# Patient Record
Sex: Male | Born: 1965 | Race: White | Hispanic: No | Smoking: Former smoker
Health system: Southern US, Community
[De-identification: ages and names within clinical notes are randomized; demographics above are authoritative.]

## PROBLEM LIST (undated history)

## (undated) DIAGNOSIS — R011 Cardiac murmur, unspecified: Secondary | ICD-10-CM

## (undated) DIAGNOSIS — N529 Male erectile dysfunction, unspecified: Secondary | ICD-10-CM

## (undated) HISTORY — DX: Male erectile dysfunction, unspecified: N52.9

## (undated) HISTORY — PX: NO PAST SURGERIES: SHX2092

## (undated) HISTORY — DX: Cardiac murmur, unspecified: R01.1

---
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
## 2006-07-13 ENCOUNTER — Ambulatory Visit: Payer: Self-pay | Admitting: Family Medicine

## 2006-12-20 ENCOUNTER — Ambulatory Visit: Payer: Self-pay | Admitting: Family Medicine

## 2006-12-30 ENCOUNTER — Ambulatory Visit: Payer: Self-pay | Admitting: Family Medicine

## 2009-06-14 ENCOUNTER — Ambulatory Visit: Payer: Self-pay | Admitting: Internal Medicine

## 2015-08-07 ENCOUNTER — Ambulatory Visit (INDEPENDENT_AMBULATORY_CARE_PROVIDER_SITE_OTHER): Payer: BLUE CROSS/BLUE SHIELD | Admitting: Family Medicine

## 2015-08-07 ENCOUNTER — Ambulatory Visit: Payer: Self-pay | Admitting: Family Medicine

## 2015-08-07 ENCOUNTER — Encounter: Payer: Self-pay | Admitting: Family Medicine

## 2015-08-07 VITALS — BP 120/76 | HR 83 | Temp 98.7°F | Ht 70.0 in | Wt 149.8 lb

## 2015-08-07 DIAGNOSIS — J40 Bronchitis, not specified as acute or chronic: Secondary | ICD-10-CM

## 2015-08-07 DIAGNOSIS — J0100 Acute maxillary sinusitis, unspecified: Secondary | ICD-10-CM | POA: Diagnosis not present

## 2015-08-07 MED ORDER — DOXYCYCLINE HYCLATE 100 MG PO TABS: 100.0000 mg | ORAL_TABLET | Freq: Two times a day (BID) | ORAL | Status: DC

## 2015-08-07 NOTE — Progress Notes (Addendum)
Name: Patrick Fuller   MRN: [id number]    DOB: [birth year]   Date:08/07/2015       Progress Note  Subjective  Chief Complaint  Chief Complaint  Patient presents with  . Cough    Sore throat, headache, fatigue. Patient that he has been sick times one week  . Pruritis    Patient states that this is located on the tip of his penis    Cough This is a new problem. The current episode started in the past 7 days. The problem has been gradually worsening. The problem occurs every few minutes. The cough is productive of purulent sputum (yellow). Associated symptoms include headaches, hemoptysis, myalgias, nasal congestion, postnasal drip, rhinorrhea and a sore throat. Pertinent negatives include no chest pain, chills, ear congestion, ear pain, fever, heartburn, rash, shortness of breath, weight loss or wheezing. The symptoms are aggravated by cold air and lying down. There is no history of asthma, bronchitis, environmental allergies or pneumonia.  Sinusitis This is a new problem. The current episode started in the past 7 days. The problem has been waxing and waning since onset. Associated symptoms include coughing, headaches, sinus pressure and a sore throat. Pertinent negatives include no chills, ear pain, neck pain or shortness of breath.    No problem-specific assessment & plan notes found for this encounter.   History reviewed. No pertinent past medical history.  Past Surgical History  Procedure Laterality Date  . No past surgeries      Family History  Problem Relation Age of Onset  . Prostate cancer Father   . Multiple sclerosis Mother     Social History   Social History  . Marital Status: Married    Spouse Name: N/A  . Number of Children: N/A  . Years of Education: N/A   Occupational History  . Not on file.   Social History Main Topics  . Smoking status: Never Smoker   . Smokeless tobacco: Not on file  . Alcohol Use: 0.0 oz/week    0 Standard drinks or equivalent  per week     Comment: occasionally  . Drug Use: No  . Sexual Activity: Not on file   Other Topics Concern  . Not on file   Social History Narrative  . No narrative on file    Allergies  Allergen Reactions  . Amoxicillin Hives  . Erythromycin Nausea And Vomiting     Review of Systems  Constitutional: Negative for fever, chills, weight loss and malaise/fatigue.  HENT: Positive for postnasal drip, rhinorrhea, sinus pressure and sore throat. Negative for ear discharge and ear pain.   Eyes: Negative for blurred vision.  Respiratory: Positive for cough and hemoptysis. Negative for sputum production, shortness of breath and wheezing.   Cardiovascular: Negative for chest pain, palpitations and leg swelling.  Gastrointestinal: Negative for heartburn, nausea, abdominal pain, diarrhea, constipation, blood in stool and melena.  Genitourinary: Negative for dysuria, urgency, frequency and hematuria.       Urethral irratation  Musculoskeletal: Positive for myalgias. Negative for back pain, joint pain and neck pain.  Skin: Negative for rash.  Neurological: Positive for headaches. Negative for dizziness, tingling, sensory change and focal weakness.  Endo/Heme/Allergies: Negative for environmental allergies and polydipsia. Does not bruise/bleed easily.  Psychiatric/Behavioral: Negative for depression and suicidal ideas. The patient is not nervous/anxious and does not have insomnia.      Objective  Filed Vitals:   08/07/15 1106  BP: 120/76  Pulse: 83  Temp: 98.7 F (37.1  C)  TempSrc: Oral  Height: 5\' 10"  (1.778 m)  Weight: 149 lb 12.8 oz (67.949 kg)  SpO2: 98%    Physical Exam  Constitutional: He is oriented to person, place, and time and well-developed, well-nourished, and in no distress.  HENT:  Head: Normocephalic.  Right Ear: External ear normal.  Left Ear: External ear normal.  Nose: Right sinus exhibits maxillary sinus tenderness. Left sinus exhibits maxillary sinus  tenderness.  Mouth/Throat: Posterior oropharyngeal erythema present.  Eyes: Conjunctivae and EOM are normal. Pupils are equal, round, and reactive to light. Right eye exhibits no discharge. Left eye exhibits no discharge. No scleral icterus.  Neck: Normal range of motion. Neck supple. No JVD present. No tracheal deviation present. No thyromegaly present.  Cardiovascular: Normal rate, regular rhythm, normal heart sounds and intact distal pulses.  Exam reveals no gallop and no friction rub.   No murmur heard. Pulmonary/Chest: Breath sounds normal. No respiratory distress. He has no wheezes. He has no rales.  Abdominal: Soft. Bowel sounds are normal. He exhibits no mass. There is no hepatosplenomegaly. There is no tenderness. There is no rebound, no guarding and no CVA tenderness.  Musculoskeletal: Normal range of motion. He exhibits no edema or tenderness.  Lymphadenopathy:    He has no cervical adenopathy.  Neurological: He is alert and oriented to person, place, and time. He has normal sensation, normal strength, normal reflexes and intact cranial nerves. No cranial nerve deficit.  Skin: Skin is warm. No rash noted.  Psychiatric: Mood and affect normal.  Nursing note and vitals reviewed.     Assessment & Plan  Problem List Items Addressed This Visit    None    Visit Diagnoses    Acute maxillary sinusitis, recurrence not specified    -  Primary    Relevant Medications    doxycycline (VIBRA-TABS) 100 MG tablet    Bronchitis        Relevant Medications    doxycycline (VIBRA-TABS) 100 MG tablet         Dr. Hayden Rasmusseneanna Jones [facility]  08/07/2015

## 2016-06-29 ENCOUNTER — Ambulatory Visit (INDEPENDENT_AMBULATORY_CARE_PROVIDER_SITE_OTHER): Payer: BLUE CROSS/BLUE SHIELD | Admitting: Family Medicine

## 2016-06-29 VITALS — BP 130/60 | HR 80 | Ht 70.0 in | Wt 156.0 lb

## 2016-06-29 DIAGNOSIS — R351 Nocturia: Secondary | ICD-10-CM

## 2016-06-29 DIAGNOSIS — N529 Male erectile dysfunction, unspecified: Secondary | ICD-10-CM | POA: Diagnosis not present

## 2016-06-29 DIAGNOSIS — Z1211 Encounter for screening for malignant neoplasm of colon: Secondary | ICD-10-CM | POA: Diagnosis not present

## 2016-06-29 DIAGNOSIS — E785 Hyperlipidemia, unspecified: Secondary | ICD-10-CM

## 2016-06-29 DIAGNOSIS — N401 Enlarged prostate with lower urinary tract symptoms: Secondary | ICD-10-CM

## 2016-06-29 DIAGNOSIS — Z0000 Encounter for general adult medical examination without abnormal findings: Secondary | ICD-10-CM

## 2016-06-29 LAB — HEMOCCULT GUIAC POC 1CARD (OFFICE): Fecal Occult Blood, POC: NEGATIVE

## 2016-06-29 MED ORDER — SILDENAFIL CITRATE 100 MG PO TABS: 100.0000 mg | ORAL_TABLET | Freq: Every day | ORAL | 11 refills | Status: DC | PRN

## 2016-06-29 NOTE — Progress Notes (Signed)
Name: Patrick Fuller   MRN: [id number]    DOB: May 04, 1966   Date:06/29/2016       Progress Note  Subjective  Chief Complaint  Chief Complaint  Patient presents with  . Erectile Dysfunction    needs refill on viagra    Patient presents for annual physical exam.   Erectile Dysfunction  This is a chronic problem. The current episode started more than 1 year ago. The problem has been waxing and waning since onset. The nature of his difficulty is maintaining erection. He reports no anxiety. Irritative symptoms include nocturia. Irritative symptoms do not include frequency or urgency. Obstructive symptoms do not include dribbling, incomplete emptying, an intermittent stream, a slower stream, straining or a weak stream. Pertinent negatives include no chills, dysuria, genital pain, hematuria, hesitancy or inability to urinate. Nothing aggravates the symptoms. Past treatments include nothing. The treatment provided mild relief. He has had no adverse reactions caused by medications. There are no known risk factors.  Benign Prostatic Hypertrophy  This is a chronic problem. The problem has been waxing and waning since onset. Irritative symptoms include nocturia. Irritative symptoms do not include frequency or urgency. Obstructive symptoms do not include dribbling, incomplete emptying, an intermittent stream, a slower stream, straining or a weak stream. Pertinent negatives include no chills, dysuria, genital pain, hematuria, hesitancy, nausea or vomiting. Nothing aggravates the symptoms. Past treatments include nothing.    No problem-specific Assessment & Plan notes found for this encounter.   No past medical history on file.  Past Surgical History:  Procedure Laterality Date  . NO PAST SURGERIES      Family History  Problem Relation Age of Onset  . Prostate cancer Father   . Multiple sclerosis Mother     Social History   Social History  . Marital status: Married    Spouse name: N/A   . Number of children: N/A  . Years of education: N/A   Occupational History  . Not on file.   Social History Main Topics  . Smoking status: Never Smoker  . Smokeless tobacco: Not on file  . Alcohol use 0.0 oz/week     Comment: occasionally  . Drug use: No  . Sexual activity: Not on file   Other Topics Concern  . Not on file   Social History Narrative  . No narrative on file    Allergies  Allergen Reactions  . Amoxicillin Hives  . Erythromycin Nausea And Vomiting     Review of Systems  Constitutional: Negative for chills, fever, malaise/fatigue and weight loss.  HENT: Negative for ear discharge, ear pain and sore throat.   Eyes: Negative for blurred vision.  Respiratory: Negative for cough, sputum production, shortness of breath and wheezing.   Cardiovascular: Negative for chest pain, palpitations and leg swelling.  Gastrointestinal: Negative for abdominal pain, blood in stool, constipation, diarrhea, heartburn, melena, nausea and vomiting.  Genitourinary: Positive for nocturia. Negative for dysuria, frequency, hematuria, hesitancy, incomplete emptying and urgency.  Musculoskeletal: Negative for back pain, joint pain, myalgias and neck pain.  Skin: Negative for rash.  Neurological: Negative for dizziness, tingling, sensory change, focal weakness and headaches.  Endo/Heme/Allergies: Negative for environmental allergies and polydipsia. Does not bruise/bleed easily.  Psychiatric/Behavioral: Negative for depression and suicidal ideas. The patient is not nervous/anxious and does not have insomnia.      Objective  Vitals:   06/29/16 0821  BP: 130/60  Pulse: 80  Weight: 156 lb (70.8 kg)  Height: 5\' 10"  (1.778 m)  Physical Exam  Constitutional: He is oriented to person, place, and time and well-developed, well-nourished, and in no distress.  HENT:  Head: Normocephalic.  Right Ear: Tympanic membrane, external ear and ear canal normal.  Left Ear: Tympanic membrane,  external ear and ear canal normal.  Nose: Nose normal.  Mouth/Throat: Oropharynx is clear and moist. No oropharyngeal exudate, posterior oropharyngeal edema or posterior oropharyngeal erythema.  Eyes: Conjunctivae and EOM are normal. Pupils are equal, round, and reactive to light. Right eye exhibits no discharge. Left eye exhibits no discharge. No scleral icterus.  Fundoscopic exam:      The right eye shows no arteriolar narrowing, no AV nicking and no papilledema.       The left eye shows no arteriolar narrowing, no AV nicking and no papilledema.  Neck: Trachea normal and normal range of motion. Neck supple. No JVD present. No spinous process tenderness and no muscular tenderness present. No tracheal deviation present. No thyromegaly present.  Cardiovascular: Normal rate, regular rhythm, S1 normal, S2 normal, normal heart sounds, intact distal pulses and normal pulses.  Exam reveals no gallop, no S3, no S4 and no friction rub.   No murmur heard. Pulmonary/Chest: Breath sounds normal. No respiratory distress. He has no wheezes. He has no rales. Right breast exhibits no inverted nipple, no mass, no nipple discharge, no skin change and no tenderness. Left breast exhibits no inverted nipple, no mass, no nipple discharge, no skin change and no tenderness. Breasts are symmetrical.  Abdominal: Soft. Bowel sounds are normal. He exhibits no mass. There is no hepatosplenomegaly. There is no tenderness. There is no rebound, no guarding and no CVA tenderness.  Genitourinary: Rectum normal, prostate normal, testes/scrotum normal and penis normal.  Musculoskeletal: Normal range of motion. He exhibits no edema or tenderness.  Lymphadenopathy:       Head (right side): No submental adenopathy present.       Head (left side): No submental and no submandibular adenopathy present.    He has no cervical adenopathy.    He has no axillary adenopathy.  Neurological: He is alert and oriented to person, place, and time.  He has normal sensation, normal strength, normal reflexes and intact cranial nerves. No cranial nerve deficit.  Skin: Skin is warm. No rash noted.  Psychiatric: Mood, affect and judgment normal.  Nursing note and vitals reviewed.     Assessment & Plan  Problem List Items Addressed This Visit    None    Visit Diagnoses    Annual physical exam    -  Primary   Relevant Orders   Lipid Profile   BPH associated with nocturia       Relevant Orders   Renal function panel   PSA   Erectile dysfunction, unspecified erectile dysfunction type       Relevant Medications   sildenafil (VIAGRA) 100 MG tablet   Colon cancer screening       Relevant Orders   POCT occult blood stool   Hyperlipidemia, unspecified hyperlipidemia type       Relevant Medications   sildenafil (VIAGRA) 100 MG tablet   Other Relevant Orders   Lipid Profile        Dr. Elizabeth Sauer [facility]  06/29/16

## 2016-06-30 LAB — RENAL FUNCTION PANEL
ALBUMIN: 4.6 g/dL (ref 3.5–5.5)
BUN/Creatinine Ratio: 20 (ref 9–20)
BUN: 13 mg/dL (ref 6–24)
CALCIUM: 9.1 mg/dL (ref 8.7–10.2)
CHLORIDE: 103 mmol/L (ref 96–106)
CO2: 24 mmol/L (ref 18–29)
CREATININE: 0.66 mg/dL — AB (ref 0.76–1.27)
GFR calc Af Amer: 132 mL/min/{1.73_m2} (ref >59)
GFR calc non Af Amer: 114 mL/min/{1.73_m2} (ref >59)
Glucose: 89 mg/dL (ref 65–99)
PHOSPHORUS: 3.1 mg/dL (ref 2.5–4.5)
Potassium: 4.6 mmol/L (ref 3.5–5.2)
Sodium: 143 mmol/L (ref 134–144)

## 2016-06-30 LAB — LIPID PANEL
CHOLESTEROL TOTAL: 185 mg/dL (ref 100–199)
Chol/HDL Ratio: 3.8 ratio units (ref 0.0–5.0)
HDL: 49 mg/dL (ref >39)
LDL CALC: 123 mg/dL — AB (ref 0–99)
TRIGLYCERIDES: 65 mg/dL (ref 0–149)
VLDL Cholesterol Cal: 13 mg/dL (ref 5–40)

## 2016-06-30 LAB — PSA: PROSTATE SPECIFIC AG, SERUM: 0.8 ng/mL (ref 0.0–4.0)

## 2016-07-02 ENCOUNTER — Other Ambulatory Visit: Payer: Self-pay

## 2016-09-21 ENCOUNTER — Other Ambulatory Visit: Payer: Self-pay

## 2016-09-21 DIAGNOSIS — N529 Male erectile dysfunction, unspecified: Secondary | ICD-10-CM

## 2016-09-21 DIAGNOSIS — R351 Nocturia: Secondary | ICD-10-CM

## 2016-10-21 NOTE — Progress Notes (Signed)
10/23/2016 9:06 AM   Patrick Fuller March 20, 1966 [id number]  Referring provider: No referring provider defined for this encounter.  Chief Complaint  Patient presents with  . New Patient (Initial Visit)    ED  /   Nocturia  referred by Dr. Dub Amis    HPI: Patient is a 51 year old Caucasian male with ED and BPH with L UTS associated with nocturia who presents as a referral from Dr. Yetta Barre for further evaluation and management of his ED.  Erectile dysfunction His SHIM score is 10, which is moderate ED.   He has been having difficulty with erections for 3 to 4 years.   His major complaint is maintaining an erections and a lack of firmness.  His libido is preserved.   His risk factors for ED are age, BPH, HLD and stress, He denies any painful erections or curvatures with his erections.   He is still having/no longer having spontaneous erections.  He has tried PDE5-inhibitors in the past with success.       SHIM    Row Name 10/23/16 0845         SHIM: Over the last 6 months:   How do you rate your confidence that you could get and keep an erection? Very Low     When you had erections with sexual stimulation, how often were your erections hard enough for penetration (entering your partner)? A Few Times (much less than half the time)     During sexual intercourse, how often were you able to maintain your erection after you had penetrated (entered) your partner? A Few Times (much less than half the time)     During sexual intercourse, how difficult was it to maintain your erection to completion of intercourse? Very Difficult     When you attempted sexual intercourse, how often was it satisfactory for you? Sometimes (about half the time)       SHIM Total Score   SHIM 10        Score: 1-7 Severe ED 8-11 Moderate ED 12-16 Mild-Moderate ED 17-21 Mild ED 22-25 No ED    BPH WITH LUTS His IPSS score today is 13, which is moderate lower urinary tract symptomatology. He is  mixed with his quality life due to his urinary symptoms.  His PVR is 0 mL.  His major complaints today are frequency, urgency, nocturia x 1-5, intermittency and a weak urinary stream.  He has had these symptoms for 2 to 3 months.  He denies any dysuria, hematuria or suprapubic pain.      He also denies any recent fevers, chills, nausea or vomiting.  His father has been diagnosed with prostate cancer ten years ago and was treated with radiation.  No sign of reoccurrence at this time.      IPSS    Row Name 10/23/16 0800         International Prostate Symptom Score   How often have you had the sensation of not emptying your bladder? Less than half the time     How often have you had to urinate less than every two hours? Less than half the time     How often have you found you stopped and started again several times when you urinated? Less than 1 in 5 times     How often have you found it difficult to postpone urination? Less than 1 in 5 times     How often have you had a weak  urinary stream? Less than half the time     How often have you had to strain to start urination? Less than half the time     How many times did you typically get up at night to urinate? 3 Times     Total IPSS Score 13       Quality of Life due to urinary symptoms   If you were to spend the rest of your life with your urinary condition just the way it is now how would you feel about that? Mixed        Score:  1-7 Mild 8-19 Moderate 20-35 Severe    PMH: Past Medical History:  Diagnosis Date  . ED (erectile dysfunction)   . Heart murmur     Surgical History: Past Surgical History:  Procedure Laterality Date  . NO PAST SURGERIES      Home Medications:  Allergies as of 10/23/2016      Reactions   Amoxicillin Hives   Erythromycin Nausea And Vomiting      Medication List       Accurate as of 10/23/16  9:06 AM. Always use your most recent med list.          sildenafil 100 MG tablet Commonly known as:   VIAGRA Take 1 tablet (100 mg total) by mouth daily as needed for erectile dysfunction.   thiamine 100 MG tablet Commonly known as:  VITAMIN B-1 Take 100 mg by mouth daily.   VITAMIN B-2 PO Take 1 tablet by mouth daily.       Allergies:  Allergies  Allergen Reactions  . Amoxicillin Hives  . Erythromycin Nausea And Vomiting    Family History: Family History  Problem Relation Age of Onset  . Multiple sclerosis Mother   . Prostate cancer Father   . Kidney cancer Neg Hx   . Bladder Cancer Neg Hx     Social History:  reports that he has quit smoking. He has never used smokeless tobacco. He reports that he drinks alcohol. He reports that he does not use drugs.  ROS: UROLOGY Frequent Urination?: Yes Hard to postpone urination?: Yes Burning/pain with urination?: No Get up at night to urinate?: Yes Leakage of urine?: No Urine stream starts and stops?: No Trouble starting stream?: Yes Do you have to strain to urinate?: No Blood in urine?: No Urinary tract infection?: No Sexually transmitted disease?: No Injury to kidneys or bladder?: No Painful intercourse?: No Weak stream?: Yes Erection problems?: Yes Penile pain?: No  Gastrointestinal Nausea?: No Vomiting?: No Indigestion/heartburn?: No Diarrhea?: No Constipation?: No  Constitutional Fever: No Night sweats?: No Weight loss?: No Fatigue?: No  Skin Skin rash/lesions?: No Itching?: No  Eyes Blurred vision?: No Double vision?: No  Ears/Nose/Throat Sore throat?: No Sinus problems?: No  Hematologic/Lymphatic Swollen glands?: No Easy bruising?: No  Cardiovascular Leg swelling?: No Chest pain?: No  Respiratory Cough?: No Shortness of breath?: No  Endocrine Excessive thirst?: No  Musculoskeletal Back pain?: Yes Joint pain?: No  Neurological Headaches?: Yes Dizziness?: Yes  Psychologic Depression?: No Anxiety?: No  Physical Exam: BP 119/73   Pulse 79   Ht 5\' 10"  (1.778 m)   Wt  155 lb (70.3 kg)   BMI 22.24 kg/m   Constitutional: Well nourished. Alert and oriented, No acute distress. HEENT: [place] AT, moist mucus membranes. Trachea midline, no masses. Cardiovascular: No clubbing, cyanosis, or edema. Respiratory: Normal respiratory effort, no increased work of breathing. GI: Abdomen is soft, non tender, non distended,  no abdominal masses. Liver and spleen not palpable.  No hernias appreciated.  Stool sample for occult testing is not indicated.   GU: No CVA tenderness.  No bladder fullness or masses.  Patient with circumcised phallus. Urethral meatus is patent.  No penile discharge. No penile lesions or rashes. Scrotum without lesions, cysts, rashes and/or edema.  Testicles are located scrotally bilaterally. No masses are appreciated in the testicles. Left and right epididymis are normal. Rectal: Patient with  normal sphincter tone. Anus and perineum without scarring or rashes. No rectal masses are appreciated. Prostate is approximately 45 grams, no nodules are appreciated. Seminal vesicles are normal. Skin: No rashes, bruises or suspicious lesions. Lymph: No cervical or inguinal adenopathy. Neurologic: Grossly intact, no focal deficits, moving all 4 extremities. Psychiatric: Normal mood and affect.  Laboratory Data: PSA History  0.8 ng/mL in 06/2016 Lab Results  Component Value Date   CREATININE 0.66 (L) 06/29/2016       Component Value Date/Time   CHOL 185 06/29/2016 0915   HDL 49 06/29/2016 0915   CHOLHDL 3.8 06/29/2016 0915   LDLCALC 123 (H) 06/29/2016 0915    Pertinent Imaging: Results for Patrick Fuller, Patrick Fuller (MRN [id number]) as of 10/23/2016 08:59  Ref. Range 10/23/2016 08:50  Scan Result Unknown 0    Assessment & Plan:    1. Erectile dysfunction  - SHIM score is 10  - I explained to the patient that in order to achieve an erection it takes good functioning of the nervous system (parasympathetic, sympathetic, sensory and motor), good blood flow into  the erectile tissue of the penis and a desire to have sex  - I explained that conditions like diabetes, hypertension, coronary artery disease, peripheral vascular disease, smoking, alcohol consumption, age, sleep apnea and BPH can diminish the ability to have an erection  - A recent study published in Sex Med 2018 Apr 13 revealed moderate to vigorous aerobic exercise for 40 minutes 4 times per week can decrease erectile problems caused by physical inactivity, obesity, hypertension, metabolic syndrome and/or cardiovascular diseases  - Continue Viagra   - RTC pending labs  2. BPH with LUTS  - IPSS score is 13/3  - Continue conservative management, avoiding bladder irritants and timed voiding's  - most bothersome symptoms is/are nocturia; PVR is 0 mL  - RTC pending sleep apnea evaluation  3. Nocturia  - I explained to the patient that nocturia is often multi-factorial and difficult to treat.  Sleeping disorders, heart conditions, peripheral vascular disease, diabetes, an enlarged prostate for men, an urethral stricture causing bladder outlet obstruction and/or certain medications can contribute to nocturia.  - I have suggested that the patient avoid caffeine after noon and alcohol in the evening.  He or she may also benefit from fluid restrictions after 6:00 in the evening and voiding just prior to bedtime.  - I have explained that research studies have showed that over 84% of patients with sleep apnea reported frequent nighttime urination.   With sleep apnea, oxygen decreases, carbon dioxide increases, the blood become more acidic, the heart rate drops and blood vessels in the lung constrict.  The body is then alerted that something is very wrong. The sleeper must wake enough to reopen the airway. By this time, the heart is racing and experiences a false signal of fluid overload. The heart excretes a hormone-like protein that tells the body to get rid of sodium and water, resulting in nocturia.  -  I  also informed the patient that  a recent study noted that decreasing sodium intake to 2.3 grams daily, if they don't have issues with hyponatremia, can also reduce the number of nightly voids  - The patient may benefit from a discussion with his or her primary care physician to see if he or she has risk factors for sleep apnea or other sleep disturbances and obtaining a sleep study.  - explained there is a new medication for nocturia, but he needs to be evaluated for sleep apnea prior to prescribing the medication  Return for pending labs.  These notes generated with voice recognition software. I apologize for typographical errors.  Michiel Cowboy, PA-C  [facility] [address], [address] [place], [place] [address] [phone]

## 2016-10-22 ENCOUNTER — Other Ambulatory Visit: Payer: Self-pay | Admitting: *Deleted

## 2016-10-22 DIAGNOSIS — N529 Male erectile dysfunction, unspecified: Secondary | ICD-10-CM

## 2016-10-23 ENCOUNTER — Other Ambulatory Visit
Admission: RE | Admit: 2016-10-23 | Discharge: 2016-10-23 | Disposition: A | Discharge status: Home / Self Care | Payer: BLUE CROSS/BLUE SHIELD | Source: Ambulatory Visit | Attending: Urology | Admitting: Urology

## 2016-10-23 ENCOUNTER — Encounter: Payer: Self-pay | Admitting: Urology

## 2016-10-23 ENCOUNTER — Ambulatory Visit (INDEPENDENT_AMBULATORY_CARE_PROVIDER_SITE_OTHER): Payer: BLUE CROSS/BLUE SHIELD | Admitting: Urology

## 2016-10-23 VITALS — BP 119/73 | HR 79 | Ht 70.0 in | Wt 155.0 lb

## 2016-10-23 DIAGNOSIS — N401 Enlarged prostate with lower urinary tract symptoms: Secondary | ICD-10-CM | POA: Diagnosis not present

## 2016-10-23 DIAGNOSIS — N529 Male erectile dysfunction, unspecified: Secondary | ICD-10-CM | POA: Diagnosis not present

## 2016-10-23 DIAGNOSIS — N138 Other obstructive and reflux uropathy: Secondary | ICD-10-CM | POA: Diagnosis not present

## 2016-10-23 DIAGNOSIS — R351 Nocturia: Secondary | ICD-10-CM

## 2016-10-23 LAB — TSH: TSH: 1.107 u[IU]/mL (ref 0.350–4.500)

## 2016-10-23 LAB — BLADDER SCAN AMB NON-IMAGING: SCAN RESULT: 0

## 2016-10-23 NOTE — Patient Instructions (Addendum)
Erectile Dysfunction Erectile dysfunction (ED) is the inability to get or keep an erection in order to have sexual intercourse. Erectile dysfunction may include:  Inability to get an erection.  Lack of enough hardness of the erection to allow penetration.  Loss of the erection before sex is finished. What are the causes? This condition may be caused by:  Certain medicines, such as:  Pain relievers.  Antihistamines.  Antidepressants.  Blood pressure medicines.  Water pills (diuretics).  Ulcer medicines.  Muscle relaxants.  Drugs.  Excessive drinking.  Psychological causes, such as:  Anxiety.  Depression.  Sadness.  Exhaustion.  Performance fear.  Stress.  Physical causes, such as:  Artery problems. This may include diabetes, smoking, liver disease, or atherosclerosis.  High blood pressure.  Hormonal problems, such as low testosterone.  Obesity.  Nerve problems. This may include back or pelvic injuries, diabetes mellitus, multiple sclerosis, or Parkinson disease. What are the signs or symptoms? Symptoms of this condition include:  Inability to get an erection.  Lack of enough hardness of the erection to allow penetration.  Loss of the erection before sex is finished.  Normal erections at some times, but with frequent unsatisfactory episodes.  Low sexual satisfaction in either partner due to erection problems.  A curved penis occurring with erection. The curve may cause pain or the penis may be too curved to allow for intercourse.  Never having nighttime erections. How is this diagnosed? This condition is often diagnosed by:  Performing a physical exam to find other diseases or specific problems with the penis.  Asking you detailed questions about the problem.  Performing blood tests to check for diabetes mellitus or to measure hormone levels.  Performing other tests to check for underlying health conditions.  Performing an ultrasound  exam to check for scarring.  Performing a test to check blood flow to the penis.  Doing a sleep study at home to measure nighttime erections. How is this treated? This condition may be treated by:  Medicine taken by mouth to help you achieve an erection (oral medicine).  Hormone replacement therapy to replace low testosterone levels.  Medicine that is injected into the penis. Your health care provider may instruct you how to give yourself these injections at home.  Vacuum pump. This is a pump with a ring on it. The pump and ring are placed on the penis and used to create pressure that helps the penis become erect.  Penile implant surgery. In this procedure, you may receive:  An inflatable implant. This consists of cylinders, a pump, and a reservoir. The cylinders can be inflated with a fluid that helps to create an erection, and they can be deflated after intercourse.  A semi-rigid implant. This consists of two silicone rubber rods. The rods provide some rigidity. They are also flexible, so the penis can both curve downward in its normal position and become straight for sexual intercourse.  Blood vessel surgery, to improve blood flow to the penis. During this procedure, a blood vessel from a different part of the body is placed into the penis to allow blood to flow around (bypass) damaged or blocked blood vessels.  Lifestyle changes, such as exercising more, losing weight, and quitting smoking. Follow these instructions at home: Medicines   Take over-the-counter and prescription medicines only as told by your health care provider. Do not increase the dosage without first discussing it with your health care provider.  If you are using self-injections, perform injections as directed by your   as directed by your health care provider. Make sure to avoid any veins that are on the surface of the penis. After giving an injection, apply pressure to the injection site for 5 minutes. General  instructions  Exercise regularly, as directed by your health care provider. Work with your health care provider to lose weight, if needed.  Do not use any products that contain nicotine or tobacco, such as cigarettes and e-cigarettes. If you need help quitting, ask your health care provider.  Before using a vacuum pump, read the instructions that come with the pump and discuss any questions with your health care provider.  Keep all follow-up visits as told by your health care provider. This is important. Contact a health care provider if:  You feel nauseous.  You vomit. Get help right away if:  You are taking oral or injectable medicines and you have an erection that lasts longer than 4 hours. If your health care provider is unavailable, go to the nearest emergency room for evaluation. An erection that lasts much longer than 4 hours can result in permanent damage to your penis.  You have severe pain in your groin or abdomen.  You develop redness or severe swelling of your penis.  You have redness spreading up into your groin or lower abdomen.  You are unable to urinate.  You experience chest pain or a rapid heart beat (palpitations) after taking oral medicines. Summary  Erectile dysfunction (ED) is the inability to get or keep an erection during sexual intercourse. This problem can usually be treated successfully.  This condition is diagnosed based on a physical exam, your symptoms, and tests to determine the cause. Treatment varies depending on the cause, and may include medicines, hormone therapy, surgery, or vacuum pump.  You may need follow-up visits to make sure that you are using your medicines or devices correctly.  Get help right away if you are taking or injecting medicines and you have an erection that lasts longer than 4 hours. This information is not intended to replace advice given to you by your health care provider. Make sure you discuss any questions you have with  your health care provider. Document Released: 05/08/2000 Document Revised: 05/27/2016 Document Reviewed: 05/27/2016 Elsevier Interactive Patient Education  2017 [employer]  Benign Prostatic Hyperplasia Benign prostatic hyperplasia is when the prostate gland is bigger than normal (enlarged). The prostate is a gland that produces the fluid that goes into semen. It is near the opening to the bladder and it surrounds the tube that drains urine out of the body (urethra). Benign prostatic hyperplasia is common among older men and it typically causes problems with urinating. The prostate grows slowly as you age. As the prostate grows, it can pinch the urethra. This causes the bladder to work too hard to pass urine, which leads to a thickened bladder wall. The bladder may eventually become weak and unable to empty completely. What are the causes? The exact cause of this condition is not known. It may be related to changes in hormones as the body ages. What increases the risk? You are more likely to develop this condition if:  You have a family history of the condition.  You are age 87 or older.  You have a history of erectile dysfunction.  You do not exercise.  You have certain medical conditions, including: ? Type 2 diabetes. ? Obesity. ? Heart and circulatory disease.  What are the signs or symptoms? Symptoms of this condition include:  Weak  or interrupted urine stream.  Dribbling or leaking urine.  Feeling like the bladder has not emptied completely.  Difficulty starting urination.  Getting up frequently at night to urinate.  Urinating more often (8 or more times a day).  Accidental loss of urine (urinary incontinence).  Pain during urination or ejaculation.  Urine with an unusual smell or color.  The size of the prostate does not always determine the severity of the symptoms. For example, a man with a large prostate may experience minor symptoms, or a man with a smaller  prostate may experience a severe blockage. How is this diagnosed? This condition may be diagnosed based on:  Your medical history and symptoms.  A physical exam. This usually includes a digital rectal exam. During this exam, your health care provider places a gloved, lubricated finger into the rectum to feel the size of the prostate.  A blood test. This test checks for high levels of a protein that is produced by the prostate (prostate specific antigen, PSA).  Tests to examine how well the urethra and bladder are functioning (urodynamic tests).  Cystoscopy. For this test, a small, tube-shaped instrument (cystoscope) is used to look inside the urethra and bladder. The cystoscope is placed into the urinary tract through the opening at the tip of the penis.  Urine tests.  Ultrasound.  How is this treated? Treatment for this condition depends on how severe your symptoms are. Treatment may include:  Active surveillance or "watchful waiting." If your symptoms are mild, your health care provider may delay treatment and ask you to keep track of your symptoms. You will have regular checkups to examine the size of your prostate, discuss symptoms, and determine whether treatment is needed.  Medicines. These may be used to: ? Stop prostate growth. ? Shrink the prostate. ? Relieve symptoms.  Lifestyle changes, including: ? Pelvic floor muscle exercises. The pelvic floor muscles are a group of muscles that relax when you urinate. ? Bladder training. This involves exercises that train the bladder to hold more urine for longer periods. ? Reducing the amount of liquid that you drink. This is especially important before sleeping and before long periods of time spent in public. ? Reducing the amount of caffeine and alcohol that you drink. ? Treating or preventing constipation.  Surgery to reduce the size of the prostate or widen the urethra. This is typically done if your symptoms are severe or there  are serious complications from the enlarged prostate.  Follow these instructions at home: Medicines  Take over-the-counter and prescription medicines as told by your health care provider.  Avoid certain medicines, such as decongestants, antihistamines, and some prescription medicines as told by your health care provider. Ask your health care provider which medicines you should avoid. General instructions  Monitor your symptoms for any changes. Tell your health care provider about any changes.  Give yourself time when you urinate.  Avoid certain beverages that can irritate the bladder, such as: ? Alcohol. ? Caffeinated drinks like coffee, tea, and cola.  Avoid drinking large amounts of liquid before bed or before going out in public.  Do pelvic floor muscle or bladder training exercises as told by your health care provider.  Keep all follow-up visits as told by your health care provider. This is important. Contact a health care provider if:  Your develop new or worse symptoms.  You have trouble getting or maintaining an erection.  You have a fever.  You have pain or burning during urination.  You have blood in your urine. Get help right away if:  You have severe pain when urinating.  You cannot urinate.  You have severe pain in your abdomen.  You are dizzy.  You faint.  You have severe back pain.  Your urine is dark red and difficult to see through.  You have large blood clots in your urine.  You have severe pain after an erection.  You have chest pain, dizziness, or nausea during sexual activity. Summary  The prostate is a gland that produces the fluid that goes into semen. It is near the opening to the bladder and it surrounds the tube that drains urine out of the body (urethra).  Benign prostatic hyperplasia is common among older men and it typically causes problems with urinating.  If your symptoms are mild, your health care provider may delay treatment  and ask you to keep track of your symptoms. You will have regular checkups to examine the size of your prostate, discuss symptoms, and determine whether treatment is needed.  If directed, you may need to avoid certain medicines, such as decongestants, antihistamines, and some prescription medicines.  Contact your health care provider if you develop new or worse symptoms. This information is not intended to replace advice given to you by your health care provider. Make sure you discuss any questions you have with your health care provider. Document Released: 05/11/2005 Document Revised: 03/30/2016 Document Reviewed: 03/30/2016 Elsevier Interactive Patient Education  2017 [employer].  Sleep Apnea Sleep apnea is a condition in which breathing pauses or becomes shallow during sleep. Episodes of sleep apnea usually last 10 seconds or longer, and they may occur as many as 20 times an hour. Sleep apnea disrupts your sleep and keeps your body from getting the rest that it needs. This condition can increase your risk of certain health problems, including:  Heart attack.  Stroke.  Obesity.  Diabetes.  Heart failure.  Irregular heartbeat.  There are three kinds of sleep apnea:  Obstructive sleep apnea. This kind is caused by a blocked or collapsed airway.  Central sleep apnea. This kind happens when the part of the brain that controls breathing does not send the correct signals to the muscles that control breathing.  Mixed sleep apnea. This is a combination of obstructive and central sleep apnea.  What are the causes? The most common cause of this condition is a collapsed or blocked airway. An airway can collapse or become blocked if:  Your throat muscles are abnormally relaxed.  Your tongue and tonsils are larger than normal.  You are overweight.  Your airway is smaller than normal.  What increases the risk? This condition is more likely to develop in people who:  Are  overweight.  Smoke.  Have a smaller than normal airway.  Are elderly.  Are male.  Drink alcohol.  Take sedatives or tranquilizers.  Have a family history of sleep apnea.  What are the signs or symptoms? Symptoms of this condition include:  Trouble staying asleep.  Daytime sleepiness and tiredness.  Irritability.  Loud snoring.  Morning headaches.  Trouble concentrating.  Forgetfulness.  Decreased interest in sex.  Unexplained sleepiness.  Mood swings.  Personality changes.  Feelings of depression.  Waking up often during the night to urinate.  Dry mouth.  Sore throat.  How is this diagnosed? This condition may be diagnosed with:  A medical history.  A physical exam.  A series of tests that are done while you are sleeping (sleep  study). These tests are usually done in a sleep lab, but they may also be done at home.  How is this treated? Treatment for this condition aims to restore normal breathing and to ease symptoms during sleep. It may involve managing health issues that can affect breathing, such as high blood pressure or obesity. Treatment may include:  Sleeping on your side.  Using a decongestant if you have nasal congestion.  Avoiding the use of depressants, including alcohol, sedatives, and narcotics.  Losing weight if you are overweight.  Making changes to your diet.  Quitting smoking.  Using a device to open your airway while you sleep, such as: ? An oral appliance. This is a custom-made mouthpiece that shifts your lower jaw forward. ? A continuous positive airway pressure (CPAP) device. This device delivers oxygen to your airway through a mask. ? A nasal expiratory positive airway pressure (EPAP) device. This device has valves that you put into each nostril. ? A bi-level positive airway pressure (BPAP) device. This device delivers oxygen to your airway through a mask.  Surgery if other treatments do not work. During surgery,  excess tissue is removed to create a wider airway.  It is important to get treatment for sleep apnea. Without treatment, this condition can lead to:  High blood pressure.  Coronary artery disease.  (Men) An inability to achieve or maintain an erection (impotence).  Reduced thinking abilities.  Follow these instructions at home:  Make any lifestyle changes that your health care provider recommends.  Eat a healthy, well-balanced diet.  Take over-the-counter and prescription medicines only as told by your health care provider.  Avoid using depressants, including alcohol, sedatives, and narcotics.  Take steps to lose weight if you are overweight.  If you were given a device to open your airway while you sleep, use it only as told by your health care provider.  Do not use any tobacco products, such as cigarettes, chewing tobacco, and e-cigarettes. If you need help quitting, ask your health care provider.  Keep all follow-up visits as told by your health care provider. This is important. Contact a health care provider if:  The device that you received to open your airway during sleep is uncomfortable or does not seem to be working.  Your symptoms do not improve.  Your symptoms get worse. Get help right away if:  You develop chest pain.  You develop shortness of breath.  You develop discomfort in your back, arms, or stomach.  You have trouble speaking.  You have weakness on one side of your body.  You have drooping in your face. These symptoms may represent a serious problem that is an emergency. Do not wait to see if the symptoms will go away. Get medical help right away. Call your local emergency services (911 in the [place] Do not drive yourself to the hospital. This information is not intended to replace advice given to you by your health care provider. Make sure you discuss any questions you have with your health care provider. Document Released: 05/01/2002 Document  Revised: 01/05/2016 Document Reviewed: 02/18/2015 Elsevier Interactive Patient Education  2018 [employer].  DASH Eating Plan DASH stands for "Dietary Approaches to Stop Hypertension." The DASH eating plan is a healthy eating plan that has been shown to reduce high blood pressure (hypertension). It may also reduce your risk for type 2 diabetes, heart disease, and stroke. The DASH eating plan may also help with weight loss. What are tips for following this plan? General  guidelines  Avoid eating more than 2,300 mg (milligrams) of salt (sodium) a day. If you have hypertension, you may need to reduce your sodium intake to 1,500 mg a day.  Limit alcohol intake to no more than 1 drink a day for nonpregnant women and 2 drinks a day for men. One drink equals 12 oz of beer, 5 oz of wine, or 1 oz of hard liquor.  Work with your health care provider to maintain a healthy body weight or to lose weight. Ask what an ideal weight is for you.  Get at least 30 minutes of exercise that causes your heart to beat faster (aerobic exercise) most days of the week. Activities may include walking, swimming, or biking.  Work with your health care provider or diet and nutrition specialist (dietitian) to adjust your eating plan to your individual calorie needs. Reading food labels  Check food labels for the amount of sodium per serving. Choose foods with less than 5 percent of the Daily Value of sodium. Generally, foods with less than 300 mg of sodium per serving fit into this eating plan.  To find whole grains, look for the word "whole" as the first word in the ingredient list. Shopping  Buy products labeled as "low-sodium" or "no salt added."  Buy fresh foods. Avoid canned foods and premade or frozen meals. Cooking  Avoid adding salt when cooking. Use salt-free seasonings or herbs instead of table salt or sea salt. Check with your health care provider or pharmacist before using salt substitutes.  Do not fry  foods. Cook foods using healthy methods such as baking, boiling, grilling, and broiling instead.  Cook with heart-healthy oils, such as olive, canola, soybean, or sunflower oil. Meal planning   Eat a balanced diet that includes: ? 5 or more servings of fruits and vegetables each day. At each meal, try to fill half of your plate with fruits and vegetables. ? Up to 6-8 servings of whole grains each day. ? Less than 6 oz of lean meat, poultry, or fish each day. A 3-oz serving of meat is about the same size as a deck of cards. One egg equals 1 oz. ? 2 servings of low-fat dairy each day. ? A serving of nuts, seeds, or beans 5 times each week. ? Heart-healthy fats. Healthy fats called Omega-3 fatty acids are found in foods such as flaxseeds and coldwater fish, like sardines, salmon, and mackerel.  Limit how much you eat of the following: ? Canned or prepackaged foods. ? Food that is high in trans fat, such as fried foods. ? Food that is high in saturated fat, such as fatty meat. ? Sweets, desserts, sugary drinks, and other foods with added sugar. ? Full-fat dairy products.  Do not salt foods before eating.  Try to eat at least 2 vegetarian meals each week.  Eat more home-cooked food and less restaurant, buffet, and fast food.  When eating at a restaurant, ask that your food be prepared with less salt or no salt, if possible. What foods are recommended? The items listed may not be a complete list. Talk with your dietitian about what dietary choices are best for you. Grains Whole-grain or whole-wheat bread. Whole-grain or whole-wheat pasta. Brown rice. Orpah Cobb. Bulgur. Whole-grain and low-sodium cereals. Pita bread. Low-fat, low-sodium crackers. Whole-wheat flour tortillas. Vegetables Fresh or frozen vegetables (raw, steamed, roasted, or grilled). Low-sodium or reduced-sodium tomato and vegetable juice. Low-sodium or reduced-sodium tomato sauce and tomato paste. Low-sodium or  reduced-sodium canned  vegetables. Fruits All fresh, dried, or frozen fruit. Canned fruit in natural juice (without added sugar). Meat and other protein foods Skinless chicken or [place]. Ground chicken or [place]. Pork with fat trimmed off. Fish and seafood. Egg whites. Dried beans, peas, or lentils. Unsalted nuts, nut butters, and seeds. Unsalted canned beans. Lean cuts of beef with fat trimmed off. Low-sodium, lean deli meat. Dairy Low-fat (1%) or fat-free (skim) milk. Fat-free, low-fat, or reduced-fat cheeses. Nonfat, low-sodium ricotta or cottage cheese. Low-fat or nonfat yogurt. Low-fat, low-sodium cheese. Fats and oils Soft margarine without trans fats. Vegetable oil. Low-fat, reduced-fat, or light mayonnaise and salad dressings (reduced-sodium). Canola, safflower, olive, soybean, and sunflower oils. Avocado. Seasoning and other foods Herbs. Spices. Seasoning mixes without salt. Unsalted popcorn and pretzels. Fat-free sweets. What foods are not recommended? The items listed may not be a complete list. Talk with your dietitian about what dietary choices are best for you. Grains Baked goods made with fat, such as croissants, muffins, or some breads. Dry pasta or rice meal packs. Vegetables Creamed or fried vegetables. Vegetables in a cheese sauce. Regular canned vegetables (not low-sodium or reduced-sodium). Regular canned tomato sauce and paste (not low-sodium or reduced-sodium). Regular tomato and vegetable juice (not low-sodium or reduced-sodium). Rosita Fire. Olives. Fruits Canned fruit in a light or heavy syrup. Fried fruit. Fruit in cream or butter sauce. Meat and other protein foods Fatty cuts of meat. Ribs. Fried meat. Tomasa Blase. Sausage. Bologna and other processed lunch meats. Salami. Fatback. Hotdogs. Bratwurst. Salted nuts and seeds. Canned beans with added salt. Canned or smoked fish. Whole eggs or egg yolks. Chicken or [place] with skin. Dairy Whole or 2% milk, cream, and half-and-half.  Whole or full-fat cream cheese. Whole-fat or sweetened yogurt. Full-fat cheese. Nondairy creamers. Whipped toppings. Processed cheese and cheese spreads. Fats and oils Butter. Stick margarine. Lard. Shortening. Ghee. Bacon fat. Tropical oils, such as coconut, palm kernel, or palm oil. Seasoning and other foods Salted popcorn and pretzels. Onion salt, garlic salt, seasoned salt, table salt, and sea salt. Worcestershire sauce. Tartar sauce. Barbecue sauce. Teriyaki sauce. Soy sauce, including reduced-sodium. Steak sauce. Canned and packaged gravies. Fish sauce. Oyster sauce. Cocktail sauce. Horseradish that you find on the shelf. Ketchup. Mustard. Meat flavorings and tenderizers. Bouillon cubes. Hot sauce and Tabasco sauce. Premade or packaged marinades. Premade or packaged taco seasonings. Relishes. Regular salad dressings. Where to find more information:  National Heart, Lung, and Blood Institute: PopSteam.is  American Heart Association: www.heart.org Summary  The DASH eating plan is a healthy eating plan that has been shown to reduce high blood pressure (hypertension). It may also reduce your risk for type 2 diabetes, heart disease, and stroke.  With the DASH eating plan, you should limit salt (sodium) intake to 2,300 mg a day. If you have hypertension, you may need to reduce your sodium intake to 1,500 mg a day.  When on the DASH eating plan, aim to eat more fresh fruits and vegetables, whole grains, lean proteins, low-fat dairy, and heart-healthy fats.  Work with your health care provider or diet and nutrition specialist (dietitian) to adjust your eating plan to your individual calorie needs. This information is not intended to replace advice given to you by your health care provider. Make sure you discuss any questions you have with your health care provider. Document Released: 04/30/2011 Document Revised: 05/04/2016 Document Reviewed: 05/04/2016 Elsevier Interactive Patient Education   2017 [employer] Inc.  DASH Eating Plan DASH stands for "Dietary Approaches to Stop Hypertension." The  DASH eating plan is a healthy eating plan that has been shown to reduce high blood pressure (hypertension). It may also reduce your risk for type 2 diabetes, heart disease, and stroke. The DASH eating plan may also help with weight loss. What are tips for following this plan? General guidelines  Avoid eating more than 2,300 mg (milligrams) of salt (sodium) a day. If you have hypertension, you may need to reduce your sodium intake to 1,500 mg a day.  Limit alcohol intake to no more than 1 drink a day for nonpregnant women and 2 drinks a day for men. One drink equals 12 oz of beer, 5 oz of wine, or 1 oz of hard liquor.  Work with your health care provider to maintain a healthy body weight or to lose weight. Ask what an ideal weight is for you.  Get at least 30 minutes of exercise that causes your heart to beat faster (aerobic exercise) most days of the week. Activities may include walking, swimming, or biking.  Work with your health care provider or diet and nutrition specialist (dietitian) to adjust your eating plan to your individual calorie needs. Reading food labels  Check food labels for the amount of sodium per serving. Choose foods with less than 5 percent of the Daily Value of sodium. Generally, foods with less than 300 mg of sodium per serving fit into this eating plan.  To find whole grains, look for the word "whole" as the first word in the ingredient list. Shopping  Buy products labeled as "low-sodium" or "no salt added."  Buy fresh foods. Avoid canned foods and premade or frozen meals. Cooking  Avoid adding salt when cooking. Use salt-free seasonings or herbs instead of table salt or sea salt. Check with your health care provider or pharmacist before using salt substitutes.  Do not fry foods. Cook foods using healthy methods such as baking, boiling, grilling, and broiling  instead.  Cook with heart-healthy oils, such as olive, canola, soybean, or sunflower oil. Meal planning   Eat a balanced diet that includes: ? 5 or more servings of fruits and vegetables each day. At each meal, try to fill half of your plate with fruits and vegetables. ? Up to 6-8 servings of whole grains each day. ? Less than 6 oz of lean meat, poultry, or fish each day. A 3-oz serving of meat is about the same size as a deck of cards. One egg equals 1 oz. ? 2 servings of low-fat dairy each day. ? A serving of nuts, seeds, or beans 5 times each week. ? Heart-healthy fats. Healthy fats called Omega-3 fatty acids are found in foods such as flaxseeds and coldwater fish, like sardines, salmon, and mackerel.  Limit how much you eat of the following: ? Canned or prepackaged foods. ? Food that is high in trans fat, such as fried foods. ? Food that is high in saturated fat, such as fatty meat. ? Sweets, desserts, sugary drinks, and other foods with added sugar. ? Full-fat dairy products.  Do not salt foods before eating.  Try to eat at least 2 vegetarian meals each week.  Eat more home-cooked food and less restaurant, buffet, and fast food.  When eating at a restaurant, ask that your food be prepared with less salt or no salt, if possible. What foods are recommended? The items listed may not be a complete list. Talk with your dietitian about what dietary choices are best for you. Grains Whole-grain or whole-wheat bread. Whole-grain  or whole-wheat pasta. Brown rice. Orpah Cobb. Bulgur. Whole-grain and low-sodium cereals. Pita bread. Low-fat, low-sodium crackers. Whole-wheat flour tortillas. Vegetables Fresh or frozen vegetables (raw, steamed, roasted, or grilled). Low-sodium or reduced-sodium tomato and vegetable juice. Low-sodium or reduced-sodium tomato sauce and tomato paste. Low-sodium or reduced-sodium canned vegetables. Fruits All fresh, dried, or frozen fruit. Canned fruit in  natural juice (without added sugar). Meat and other protein foods Skinless chicken or [place]. Ground chicken or [place]. Pork with fat trimmed off. Fish and seafood. Egg whites. Dried beans, peas, or lentils. Unsalted nuts, nut butters, and seeds. Unsalted canned beans. Lean cuts of beef with fat trimmed off. Low-sodium, lean deli meat. Dairy Low-fat (1%) or fat-free (skim) milk. Fat-free, low-fat, or reduced-fat cheeses. Nonfat, low-sodium ricotta or cottage cheese. Low-fat or nonfat yogurt. Low-fat, low-sodium cheese. Fats and oils Soft margarine without trans fats. Vegetable oil. Low-fat, reduced-fat, or light mayonnaise and salad dressings (reduced-sodium). Canola, safflower, olive, soybean, and sunflower oils. Avocado. Seasoning and other foods Herbs. Spices. Seasoning mixes without salt. Unsalted popcorn and pretzels. Fat-free sweets. What foods are not recommended? The items listed may not be a complete list. Talk with your dietitian about what dietary choices are best for you. Grains Baked goods made with fat, such as croissants, muffins, or some breads. Dry pasta or rice meal packs. Vegetables Creamed or fried vegetables. Vegetables in a cheese sauce. Regular canned vegetables (not low-sodium or reduced-sodium). Regular canned tomato sauce and paste (not low-sodium or reduced-sodium). Regular tomato and vegetable juice (not low-sodium or reduced-sodium). Rosita Fire. Olives. Fruits Canned fruit in a light or heavy syrup. Fried fruit. Fruit in cream or butter sauce. Meat and other protein foods Fatty cuts of meat. Ribs. Fried meat. Tomasa Blase. Sausage. Bologna and other processed lunch meats. Salami. Fatback. Hotdogs. Bratwurst. Salted nuts and seeds. Canned beans with added salt. Canned or smoked fish. Whole eggs or egg yolks. Chicken or [place] with skin. Dairy Whole or 2% milk, cream, and half-and-half. Whole or full-fat cream cheese. Whole-fat or sweetened yogurt. Full-fat cheese. Nondairy  creamers. Whipped toppings. Processed cheese and cheese spreads. Fats and oils Butter. Stick margarine. Lard. Shortening. Ghee. Bacon fat. Tropical oils, such as coconut, palm kernel, or palm oil. Seasoning and other foods Salted popcorn and pretzels. Onion salt, garlic salt, seasoned salt, table salt, and sea salt. Worcestershire sauce. Tartar sauce. Barbecue sauce. Teriyaki sauce. Soy sauce, including reduced-sodium. Steak sauce. Canned and packaged gravies. Fish sauce. Oyster sauce. Cocktail sauce. Horseradish that you find on the shelf. Ketchup. Mustard. Meat flavorings and tenderizers. Bouillon cubes. Hot sauce and Tabasco sauce. Premade or packaged marinades. Premade or packaged taco seasonings. Relishes. Regular salad dressings. Where to find more information:  National Heart, Lung, and Blood Institute: PopSteam.is  American Heart Association: www.heart.org Summary  The DASH eating plan is a healthy eating plan that has been shown to reduce high blood pressure (hypertension). It may also reduce your risk for type 2 diabetes, heart disease, and stroke.  With the DASH eating plan, you should limit salt (sodium) intake to 2,300 mg a day. If you have hypertension, you may need to reduce your sodium intake to 1,500 mg a day.  When on the DASH eating plan, aim to eat more fresh fruits and vegetables, whole grains, lean proteins, low-fat dairy, and heart-healthy fats.  Work with your health care provider or diet and nutrition specialist (dietitian) to adjust your eating plan to your individual calorie needs. This information is not intended to replace advice given to  you by your health care provider. Make sure you discuss any questions you have with your health care provider. Document Released: 04/30/2011 Document Revised: 05/04/2016 Document Reviewed: 05/04/2016 Elsevier Interactive Patient Education  2017 [employer]

## 2016-10-24 LAB — TESTOSTERONE: Testosterone: 564 ng/dL (ref 264–916)

## 2016-10-26 ENCOUNTER — Telehealth: Payer: Self-pay

## 2016-10-26 NOTE — Telephone Encounter (Signed)
Called patient. No answer. Phone going straight vmail. Sent MyChart letter.

## 2016-10-26 NOTE — Telephone Encounter (Signed)
Called patient, went straight to vmail. No message left per no DPR on file. Will try later.

## 2016-10-26 NOTE — Telephone Encounter (Signed)
-----   Message from Harle BattiestShannon A McGowan, PA-C sent at 10/25/2016  6:37 PM EDT ----- Please let Mr. Patrick Fuller know that his testosterone is within normal range.

## 2016-10-27 MED ORDER — TADALAFIL 5 MG PO TABS: 5.0000 mg | ORAL_TABLET | Freq: Every day | ORAL | 3 refills | Status: DC | PRN

## 2016-10-27 NOTE — Telephone Encounter (Signed)
Pt called in gave lab results. He verbalized understanding. Pt says he has been researching his nocturia problem. Would like to know if Carollee HerterShannon thinks Cialis would help with issue? If so should he switch from Viagra to Cialis?

## 2016-10-27 NOTE — Addendum Note (Signed)
Addended by: Doree BarthelLOWE, CASANDRA on: 10/27/2016 04:28 PM   Modules accepted: Orders

## 2016-10-27 NOTE — Telephone Encounter (Signed)
Spoke to patient. Gave instructions

## 2016-10-27 NOTE — Telephone Encounter (Signed)
We can try to switch from Viagra to Cialis, but if he has sleep apnea it will not be effective.  Cialis 5 mg daily, # 30 with 3 refills.

## 2016-11-03 ENCOUNTER — Telehealth: Payer: Self-pay

## 2016-11-03 NOTE — Telephone Encounter (Signed)
Cialis 5mg  PA approved.  Approval dates: 10/30/2016-05/24/2038 Ref# [id number] Notified pharmacy.

## 2017-02-10 ENCOUNTER — Ambulatory Visit (INDEPENDENT_AMBULATORY_CARE_PROVIDER_SITE_OTHER): Payer: BLUE CROSS/BLUE SHIELD | Admitting: Family Medicine

## 2017-02-10 ENCOUNTER — Encounter: Payer: Self-pay | Admitting: Family Medicine

## 2017-02-10 VITALS — BP 130/70 | HR 64 | Ht 70.0 in | Wt 148.0 lb

## 2017-02-10 DIAGNOSIS — Z113 Encounter for screening for infections with a predominantly sexual mode of transmission: Secondary | ICD-10-CM | POA: Diagnosis not present

## 2017-02-10 DIAGNOSIS — R636 Underweight: Secondary | ICD-10-CM | POA: Diagnosis not present

## 2017-02-10 DIAGNOSIS — Z0000 Encounter for general adult medical examination without abnormal findings: Secondary | ICD-10-CM

## 2017-02-10 DIAGNOSIS — Z114 Encounter for screening for human immunodeficiency virus [HIV]: Secondary | ICD-10-CM | POA: Diagnosis not present

## 2017-02-10 DIAGNOSIS — Z20828 Contact with and (suspected) exposure to other viral communicable diseases: Secondary | ICD-10-CM

## 2017-02-10 DIAGNOSIS — Z1211 Encounter for screening for malignant neoplasm of colon: Secondary | ICD-10-CM

## 2017-02-10 DIAGNOSIS — Z23 Encounter for immunization: Secondary | ICD-10-CM | POA: Diagnosis not present

## 2017-02-10 LAB — HEMOCCULT GUIAC POC 1CARD (OFFICE): FECAL OCCULT BLD: NEGATIVE

## 2017-02-10 NOTE — Progress Notes (Signed)
Name: Patrick Fuller   MRN: [id number]    DOB: 1966/05/06   Date:02/10/2017       Progress Note  Subjective  Chief Complaint  Chief Complaint  Patient presents with  . Annual Exam    need flu and TDAP  . lab work    HIV and herpes screening?    Patient presents for annual physical exam.    No problem-specific Assessment & Plan notes found for this encounter.   Past Medical History:  Diagnosis Date  . ED (erectile dysfunction)   . Heart murmur     Past Surgical History:  Procedure Laterality Date  . NO PAST SURGERIES      Family History  Problem Relation Age of Onset  . Multiple sclerosis Mother   . Prostate cancer Father   . Kidney cancer Neg Hx   . Bladder Cancer Neg Hx     Social History   Social History  . Marital status: Divorced    Spouse name: N/A  . Number of children: N/A  . Years of education: N/A   Occupational History  . Not on file.   Social History Main Topics  . Smoking status: Former Games developer  . Smokeless tobacco: Never Used     Comment: social in college  . Alcohol use 0.0 oz/week     Comment: occasionally  . Drug use: No  . Sexual activity: Not on file   Other Topics Concern  . Not on file   Social History Narrative  . No narrative on file    Allergies  Allergen Reactions  . Amoxicillin Hives  . Erythromycin Nausea And Vomiting    Outpatient Medications Prior to Visit  Medication Sig Dispense Refill  . Riboflavin (VITAMIN B-2 PO) Take 1 tablet by mouth daily.    . sildenafil (VIAGRA) 100 MG tablet Take 1 tablet (100 mg total) by mouth daily as needed for erectile dysfunction. 10 tablet 11  . thiamine (VITAMIN B-1) 100 MG tablet Take 100 mg by mouth daily.    . tadalafil (CIALIS) 5 MG tablet Take 1 tablet (5 mg total) by mouth daily as needed for erectile dysfunction. 30 tablet 3   No facility-administered medications prior to visit.     Review of Systems  Constitutional: Negative for chills, fever,  malaise/fatigue and weight loss.  HENT: Negative for ear discharge, ear pain and sore throat.   Eyes: Negative for blurred vision.  Respiratory: Negative for cough, sputum production, shortness of breath and wheezing.   Cardiovascular: Negative for chest pain, palpitations and leg swelling.  Gastrointestinal: Negative for abdominal pain, blood in stool, constipation, diarrhea, heartburn, melena and nausea.  Genitourinary: Negative for dysuria, frequency, hematuria and urgency.  Musculoskeletal: Negative for back pain, joint pain, myalgias and neck pain.  Skin: Negative for rash.  Neurological: Negative for dizziness, tingling, sensory change, focal weakness and headaches.  Endo/Heme/Allergies: Negative for environmental allergies and polydipsia. Does not bruise/bleed easily.  Psychiatric/Behavioral: Negative for depression and suicidal ideas. The patient is not nervous/anxious and does not have insomnia.      Objective  Vitals:   02/10/17 0833  BP: 130/70  Pulse: 64  Weight: 148 lb (67.1 kg)  Height:  (1.778 m)    Physical Exam  Constitutional: He is oriented to person, place, and time and well-developed, well-nourished, and in no distress. Vital signs are normal.  HENT:  Head: Normocephalic and atraumatic.  Right Ear: Tympanic membrane, external ear and ear canal normal.  Left Ear:  Tympanic membrane, external ear and ear canal normal.  Nose: Nose normal.  Mouth/Throat: Uvula is midline, oropharynx is clear and moist and mucous membranes are normal.  Eyes: Pupils are equal, round, and reactive to light. Conjunctivae and EOM are normal. Right eye exhibits no discharge. Left eye exhibits no discharge. No scleral icterus.  Fundoscopic exam:      The right eye shows no arteriolar narrowing and no AV nicking.       The left eye shows no arteriolar narrowing and no AV nicking.  Neck: Trachea normal and normal range of motion. Neck supple. Normal carotid pulses, no hepatojugular  reflux and no JVD present. Carotid bruit is not present. No tracheal deviation present. No thyromegaly present.  Cardiovascular: Normal rate, regular rhythm, S1 normal, S2 normal, normal heart sounds, intact distal pulses and normal pulses.  PMI is not displaced.  Exam reveals no gallop, no S3, no S4 and no friction rub.   No murmur heard. Pulmonary/Chest: Breath sounds normal. No respiratory distress. He has no wheezes. He has no rales. He exhibits no mass. Right breast exhibits no mass. Left breast exhibits no mass.  Abdominal: Soft. Normal aorta and bowel sounds are normal. He exhibits no mass. There is no hepatosplenomegaly. There is no tenderness. There is no rebound, no guarding and no CVA tenderness.  Genitourinary: Rectum normal, prostate normal, testes/scrotum normal and penis normal. Rectal exam shows guaiac negative stool. He exhibits no abnormal testicular mass, no testicular tenderness, no abnormal scrotal mass and no scrotal tenderness. No discharge found.  Musculoskeletal: Normal range of motion. He exhibits no edema or tenderness.  Lymphadenopathy:       Head (right side): No submandibular adenopathy present.       Head (left side): No submandibular adenopathy present.    He has no cervical adenopathy.    He has no axillary adenopathy.  Neurological: He is alert and oriented to person, place, and time. He has normal sensation, normal strength, normal reflexes and intact cranial nerves. No cranial nerve deficit.  Skin: Skin is warm and intact. No rash noted.  Psychiatric: Mood and affect normal.  Nursing note and vitals reviewed.     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 herpes simplex virus (HSV)       Relevant Orders   HSV 2 antibody, IgG   Underweight       Relevant Orders   Lipid Profile   Renal Function  Panel   HSV 2 antibody, IgG   HIV antibody   Encounter for screening for HIV       Relevant Orders   HIV antibody   Influenza vaccine needed       Relevant Orders   Flu Vaccine QUAD 36+ mos IM (Completed)   Screening examination for STD (sexually transmitted disease)       Relevant Orders   GC/Chlamydia Probe Amp   Need for Tdap vaccination       Relevant Orders   Tdap vaccine greater than or equal to 7yo IM (Completed)      No orders of the defined types were placed in this encounter.     Dr. Hayden [facility]  02/10/17

## 2017-02-12 LAB — RENAL FUNCTION PANEL
ALBUMIN: 4.9 g/dL (ref 3.5–5.5)
BUN/Creatinine Ratio: 12 (ref 9–20)
BUN: 9 mg/dL (ref 6–24)
CO2: 24 mmol/L (ref 20–29)
Calcium: 9.4 mg/dL (ref 8.7–10.2)
Chloride: 102 mmol/L (ref 96–106)
Creatinine, Ser: 0.76 mg/dL (ref 0.76–1.27)
GFR calc Af Amer: 125 mL/min/{1.73_m2} (ref >59)
GFR, EST NON AFRICAN AMERICAN: 108 mL/min/{1.73_m2} (ref >59)
GLUCOSE: 83 mg/dL (ref 65–99)
PHOSPHORUS: 2.4 mg/dL — AB (ref 2.5–4.5)
POTASSIUM: 4.9 mmol/L (ref 3.5–5.2)
SODIUM: 143 mmol/L (ref 134–144)

## 2017-02-12 LAB — HSV 2 ANTIBODY, IGG: HSV 2 IgG, Type Spec: <0.91 index (ref 0.00–0.90)

## 2017-02-12 LAB — LIPID PANEL
CHOLESTEROL TOTAL: 171 mg/dL (ref 100–199)
Chol/HDL Ratio: 3.9 ratio (ref 0.0–5.0)
HDL: 44 mg/dL (ref >39)
LDL Calculated: 111 mg/dL — ABNORMAL HIGH (ref 0–99)
TRIGLYCERIDES: 78 mg/dL (ref 0–149)
VLDL Cholesterol Cal: 16 mg/dL (ref 5–40)

## 2017-02-12 LAB — GC/CHLAMYDIA PROBE AMP
Chlamydia trachomatis, NAA: NEGATIVE
NEISSERIA GONORRHOEAE BY PCR: NEGATIVE

## 2017-02-12 LAB — HIV ANTIBODY (ROUTINE TESTING W REFLEX): HIV Screen 4th Generation wRfx: NONREACTIVE

## 2017-02-15 ENCOUNTER — Other Ambulatory Visit: Payer: Self-pay | Admitting: Family Medicine

## 2017-08-19 NOTE — Progress Notes (Signed)
08/20/2017 10:08 AM   Lovena Le 12-30-1965 [id number]  Referring provider: Duanne Limerick, MD [address] [place], [place] [address]  Chief Complaint  Patient presents with  . Benign Prostatic Hypertrophy  . Erectile Dysfunction  . Nocturia    HPI: Patient is a 52 year old Caucasian male with ED and BPH with L UTS associated with nocturia who presents today for a lump in his testicle.    Testicular lump Patient states that one week ago while in the shower he noted a lump in his right testicle.  He states it is not painful.  He denied any trauma or infection prior to its discovery.  He has not had any changes with ejaculation.  He has not had any changes with his urinary symptoms.  Erectile dysfunction His SHIM score is 9, which is moderate ED.   His previous SHIM score was 10.  He has been having difficulty with erections for 3 to 4 years.   His major complaint is maintaining an erections and a lack of firmness.  His libido is preserved.   His risk factors for ED are age, BPH, HLD and stress, He denies any painful erections or curvatures with his erections.   He is still having spontaneous erections.  He has tried PDE5-inhibitors in the past with success.   His testosterone level was found to be 564 in 10/2016.   SHIM    Row Name 08/20/17 1914         SHIM: Over the last 6 months:   How do you rate your confidence that you could get and keep an erection?  Low     When you had erections with sexual stimulation, how often were your erections hard enough for penetration (entering your partner)?  A Few Times (much less than half the time)     During sexual intercourse, how often were you able to maintain your erection after you had penetrated (entered) your partner?  A Few Times (much less than half the time)     During sexual intercourse, how difficult was it to maintain your erection to completion of intercourse?  Extremely Difficult     When you attempted  sexual intercourse, how often was it satisfactory for you?  Almost Never or Never       SHIM Total Score   SHIM  8        Score: 1-7 Severe ED 8-11 Moderate ED 12-16 Mild-Moderate ED 17-21 Mild ED 22-25 No ED    BPH WITH LUTS His IPSS score today is 11, which is moderate lower urinary tract symptomatology. He is mostly satisfied with his quality life due to his urinary symptoms.  His previous I PSS score was 13/3.  His previous PVR was 0 mL.  His major complaints today are nocturia x 1-3 (improved).  He denies any dysuria, hematuria or suprapubic pain.  He also denies any recent fevers, chills, nausea or vomiting.  His father has been diagnosed with prostate cancer and was treated with radiation.  He passed at age 29 from complications of MDS.   IPSS    Row Name 08/20/17 0900         International Prostate Symptom Score   How often have you had the sensation of not emptying your bladder?  Less than 1 in 5     How often have you had to urinate less than every two hours?  Less than 1 in 5 times  How often have you found you stopped and started again several times when you urinated?  Less than half the time     How often have you found it difficult to postpone urination?  Less than half the time     How often have you had a weak urinary stream?  Less than half the time     How often have you had to strain to start urination?  Less than half the time     How many times did you typically get up at night to urinate?  1 Time     Total IPSS Score  11       Quality of Life due to urinary symptoms   If you were to spend the rest of your life with your urinary condition just the way it is now how would you feel about that?  Mostly Satisfied        Score:  1-7 Mild 8-19 Moderate 20-35 Severe    PMH: Past Medical History:  Diagnosis Date  . ED (erectile dysfunction)   . Heart murmur     Surgical History: Past Surgical History:  Procedure Laterality Date  . NO PAST  SURGERIES      Home Medications:  Allergies as of 08/20/2017      Reactions   Amoxicillin Hives   Erythromycin Nausea And Vomiting      Medication List        Accurate as of 08/20/17 10:08 AM. Always use your most recent med list.          sildenafil 100 MG tablet Commonly known as:  VIAGRA Take 1 tablet (100 mg total) by mouth daily as needed for erectile dysfunction.   sildenafil 20 MG tablet Commonly known as:  REVATIO Take 3 to 5 tablets two hours before intercouse on an empty stomach.  Do not take with nitrates.   thiamine 100 MG tablet Commonly known as:  VITAMIN B-1 Take 100 mg by mouth daily.   VITAMIN B-2 PO Take 1 tablet by mouth daily.       Allergies:  Allergies  Allergen Reactions  . Amoxicillin Hives  . Erythromycin Nausea And Vomiting    Family History: Family History  Problem Relation Age of Onset  . Multiple sclerosis Mother   . Prostate cancer Father   . Kidney cancer Neg Hx   . Bladder Cancer Neg Hx     Social History:  reports that he has quit smoking. He has never used smokeless tobacco. He reports that he drinks alcohol. He reports that he does not use drugs.  ROS: UROLOGY Frequent Urination?: No Hard to postpone urination?: No Burning/pain with urination?: No Get up at night to urinate?: No Leakage of urine?: No Urine stream starts and stops?: No Trouble starting stream?: No Do you have to strain to urinate?: No Blood in urine?: No Urinary tract infection?: No Sexually transmitted disease?: No Injury to kidneys or bladder?: No Painful intercourse?: No Weak stream?: No Erection problems?: Yes Penile pain?: No  Gastrointestinal Nausea?: No Vomiting?: No Indigestion/heartburn?: No Diarrhea?: No Constipation?: No  Constitutional Fever: No Night sweats?: No Weight loss?: No Fatigue?: No  Skin Skin rash/lesions?: No Itching?: No  Eyes Blurred vision?: No Double vision?: No  Ears/Nose/Throat Sore throat?:  No Sinus problems?: No  Hematologic/Lymphatic Swollen glands?: No Easy bruising?: No  Cardiovascular Leg swelling?: No Chest pain?: No  Respiratory Cough?: No Shortness of breath?: No  Endocrine Excessive thirst?: No  Musculoskeletal Back pain?:  Yes Joint pain?: No  Neurological Headaches?: Yes Dizziness?: Yes  Psychologic Depression?: No Anxiety?: No  Physical Exam: BP 111/69   Pulse 69   Ht 5\' 10"  (1.778 m)   Wt 150 lb (68 kg)   BMI 21.52 kg/m   Constitutional: Well nourished. Alert and oriented, No acute distress. HEENT: [place] AT, moist mucus membranes. Trachea midline, no masses. Cardiovascular: No clubbing, cyanosis, or edema. Respiratory: Normal respiratory effort, no increased work of breathing. GI: Abdomen is soft, non tender, non distended, no abdominal masses. Liver and spleen not palpable.  No hernias appreciated.  Stool sample for occult testing is not indicated.   GU: No CVA tenderness.  No bladder fullness or masses.  Patient with circumcised phallus.  Urethral meatus is patent.  No penile discharge. No penile lesions or rashes. Scrotum without lesions, cysts, rashes and/or edema.  Testicles are located scrotally bilaterally. No masses are appreciated in the testicles. Left and right epididymis are normal.  Right spermatocele is noted (1 cm x 1 cm) Rectal: Patient with  normal sphincter tone. Anus and perineum without scarring or rashes. No rectal masses are appreciated. Prostate is approximately 45 grams, no nodules are appreciated. Seminal vesicles are normal. Skin: No rashes, bruises or suspicious lesions. Lymph: No cervical or inguinal adenopathy. Neurologic: Grossly intact, no focal deficits, moving all 4 extremities. Psychiatric: Normal mood and affect.    Laboratory Data: PSA History  0.8 ng/mL in 06/2016 Lab Results  Component Value Date   CREATININE 0.76 02/10/2017       Component Value Date/Time   CHOL 171 02/10/2017 0936   HDL 44  02/10/2017 0936   CHOLHDL 3.9 02/10/2017 0936   LDLCALC 111 (H) 02/10/2017 0936    Assessment & Plan:    1. Erectile dysfunction  - SHIM score is 9, it is worsening  - I explained to the patient that in order to achieve an erection it takes good functioning of the nervous system (parasympathetic, sympathetic, sensory and motor), good blood flow into the erectile tissue of the penis and a desire to have sex  - I explained that conditions like diabetes, hypertension, coronary artery disease, peripheral vascular disease, smoking, alcohol consumption, age, sleep apnea and BPH can diminish the ability to have an erection  - A recent study published in Sex Med 2018 Apr 13 revealed moderate to vigorous aerobic exercise for 40 minutes 4 times per week can decrease erectile problems caused by physical inactivity, obesity, hypertension, metabolic syndrome and/or cardiovascular diseases  - Continue Viagra   - RTC in one year for SHIM and exam  2. BPH with LUTS  - IPSS score is 11/2, it is improved   - Continue conservative management, avoiding bladder irritants and timed voiding's  - most bothersome symptoms is/are nocturia  - RTC in one year for I PSS, PSA and exam  3. Nocturia  - I explained to the patient that nocturia is often multi-factorial and difficult to treat.  Sleeping disorders, heart conditions, peripheral vascular disease, diabetes, an enlarged prostate for men, an urethral stricture causing bladder outlet obstruction and/or certain medications can contribute to nocturia.  - I have suggested that the patient avoid caffeine after noon and alcohol in the evening.  He or she may also benefit from fluid restrictions after 6:00 in the evening and voiding just prior to bedtime.  - I have explained that research studies have showed that over 84% of patients with sleep apnea reported frequent nighttime urination.   With sleep  apnea, oxygen decreases, carbon dioxide increases, the blood become more  acidic, the heart rate drops and blood vessels in the lung constrict.  The body is then alerted that something is very wrong. The sleeper must wake enough to reopen the airway. By this time, the heart is racing and experiences a false signal of fluid overload. The heart excretes a hormone-like protein that tells the body to get rid of sodium and water, resulting in nocturia.  -  I also informed the patient that a recent study noted that decreasing sodium intake to 2.3 grams daily, if they don't have issues with hyponatremia, can also reduce the number of nightly voids  - The patient may benefit from a discussion with his or her primary care physician to see if he or she has risk factors for sleep apnea or other sleep disturbances and obtaining a sleep study.  - explained there is a new medication for nocturia, but he needs to be evaluated for sleep apnea prior to prescribing the medication  4.  Spermatocele Patient's exam notes a cystic area within the epididymis We will obtain scrotal ultrasound for confirmation  Return in about 1 year (around 08/21/2018) for IPSS, SHIM, PSA and exam.  These notes generated with voice recognition software. I apologize for typographical errors.  Michiel CowboySHANNON Torben Soloway, PA-C  [facility] [address], [address] [place], [place] [id number] [phone]

## 2017-08-20 ENCOUNTER — Other Ambulatory Visit: Payer: Self-pay

## 2017-08-20 ENCOUNTER — Ambulatory Visit (INDEPENDENT_AMBULATORY_CARE_PROVIDER_SITE_OTHER): Payer: BLUE CROSS/BLUE SHIELD | Admitting: Urology

## 2017-08-20 ENCOUNTER — Other Ambulatory Visit
Admission: RE | Admit: 2017-08-20 | Discharge: 2017-08-20 | Disposition: A | Discharge status: Home / Self Care | Payer: BLUE CROSS/BLUE SHIELD | Source: Ambulatory Visit | Attending: Urology | Admitting: Urology

## 2017-08-20 ENCOUNTER — Encounter: Payer: Self-pay | Admitting: Urology

## 2017-08-20 VITALS — BP 111/69 | HR 69 | Ht 70.0 in | Wt 150.0 lb

## 2017-08-20 DIAGNOSIS — N138 Other obstructive and reflux uropathy: Secondary | ICD-10-CM

## 2017-08-20 DIAGNOSIS — R351 Nocturia: Secondary | ICD-10-CM | POA: Diagnosis not present

## 2017-08-20 DIAGNOSIS — N401 Enlarged prostate with lower urinary tract symptoms: Secondary | ICD-10-CM

## 2017-08-20 DIAGNOSIS — N529 Male erectile dysfunction, unspecified: Secondary | ICD-10-CM | POA: Diagnosis not present

## 2017-08-20 DIAGNOSIS — N4340 Spermatocele of epididymis, unspecified: Secondary | ICD-10-CM

## 2017-08-20 LAB — PSA: Prostatic Specific Antigen: 0.61 ng/mL (ref 0.00–4.00)

## 2017-08-20 MED ORDER — SILDENAFIL CITRATE 20 MG PO TABS: ORAL_TABLET | ORAL | 3 refills | Status: DC

## 2017-08-23 ENCOUNTER — Encounter: Payer: Self-pay | Admitting: Family Medicine

## 2017-08-23 ENCOUNTER — Telehealth: Payer: Self-pay | Admitting: Family Medicine

## 2017-08-23 NOTE — Telephone Encounter (Signed)
-----   Message from Patrick BattiestShannon A McGowan, PA-C sent at 08/22/2017  7:59 PM EDT ----- Please let Cristal Deerhristopher know that his PSA is stable at 0.61.

## 2017-08-23 NOTE — Telephone Encounter (Signed)
Normal letter sent

## 2017-08-30 ENCOUNTER — Ambulatory Visit: Payer: BLUE CROSS/BLUE SHIELD

## 2017-08-30 ENCOUNTER — Ambulatory Visit
Admission: RE | Admit: 2017-08-30 | Discharge: 2017-08-30 | Disposition: A | Discharge status: Home / Self Care | Payer: BLUE CROSS/BLUE SHIELD | Source: Ambulatory Visit | Attending: Urology | Admitting: Urology

## 2017-08-30 DIAGNOSIS — N4340 Spermatocele of epididymis, unspecified: Secondary | ICD-10-CM | POA: Insufficient documentation

## 2017-08-31 ENCOUNTER — Telehealth: Payer: Self-pay | Admitting: Family Medicine

## 2017-08-31 NOTE — Telephone Encounter (Signed)
-----   Message from Harle BattiestShannon A McGowan, PA-C sent at 08/30/2017  6:25 PM EDT ----- Please let Patrick Fuller know that his ultrasound confirmed that his lump is just a cyst.  There is no intervention warranted.

## 2017-08-31 NOTE — Telephone Encounter (Signed)
Patient notified

## 2018-03-14 ENCOUNTER — Encounter: Payer: Self-pay | Admitting: Family Medicine

## 2018-03-14 ENCOUNTER — Ambulatory Visit (INDEPENDENT_AMBULATORY_CARE_PROVIDER_SITE_OTHER): Payer: BLUE CROSS/BLUE SHIELD | Admitting: Family Medicine

## 2018-03-14 VITALS — BP 102/62 | HR 64 | Ht 70.0 in | Wt 151.0 lb

## 2018-03-14 DIAGNOSIS — Z0000 Encounter for general adult medical examination without abnormal findings: Secondary | ICD-10-CM | POA: Diagnosis not present

## 2018-03-14 DIAGNOSIS — Z23 Encounter for immunization: Secondary | ICD-10-CM

## 2018-03-14 DIAGNOSIS — N529 Male erectile dysfunction, unspecified: Secondary | ICD-10-CM | POA: Diagnosis not present

## 2018-03-14 DIAGNOSIS — Z1211 Encounter for screening for malignant neoplasm of colon: Secondary | ICD-10-CM | POA: Diagnosis not present

## 2018-03-14 LAB — HEMOCCULT GUIAC POC 1CARD (OFFICE): Fecal Occult Blood, POC: NEGATIVE

## 2018-03-14 MED ORDER — SILDENAFIL CITRATE 100 MG PO TABS
100.0000 mg | ORAL_TABLET | Freq: Every day | ORAL | 11 refills | Status: DC | PRN
Start: 2018-03-14 — End: 2019-03-23

## 2018-03-14 NOTE — Progress Notes (Signed)
Date:  03/14/2018   Name:  Patrick Fuller   DOB:  January 18, 1966   MRN:  [id number]   Chief Complaint: Annual Exam; hypotestosteronism (viagra refill); and Flu Vaccine Patient is a 52 year old male who presents for a comprehensive physical exam. The patient reports the following problems: "lightheaded/positional/dizziness". Health maintenance has been reviewed influenza.    Review of Systems  Constitutional: Negative for appetite change, chills, fatigue, fever and unexpected weight change.  HENT: Negative for drooling, ear discharge, ear pain, facial swelling, hearing loss, mouth sores, nosebleeds, sneezing, sore throat and trouble swallowing.   Eyes: Negative for photophobia, pain, discharge, redness, itching and visual disturbance.  Respiratory: Negative for cough, choking, chest tightness, shortness of breath and wheezing.   Cardiovascular: Negative for chest pain, palpitations and leg swelling.  Gastrointestinal: Negative for abdominal pain, blood in stool, constipation, diarrhea, nausea, rectal pain and vomiting.  Endocrine: Negative for cold intolerance, heat intolerance, polydipsia, polyphagia and polyuria.  Genitourinary: Negative for decreased urine volume, difficulty urinating, discharge, dysuria, flank pain, frequency, hematuria, penile pain, penile swelling, scrotal swelling, testicular pain and urgency.  Musculoskeletal: Negative for back pain, joint swelling, myalgias, neck pain and neck stiffness.  Skin: Negative for color change and rash.  Allergic/Immunologic: Negative for environmental allergies and immunocompromised state.  Neurological: Negative for dizziness, tremors, seizures, syncope, speech difficulty, weakness, light-headedness, numbness and headaches.  Hematological: Does not bruise/bleed easily.  Psychiatric/Behavioral: Negative for agitation, behavioral problems, confusion, dysphoric mood, hallucinations, self-injury and suicidal ideas. The patient is not  nervous/anxious.     There are no active problems to display for this patient.   Allergies  Allergen Reactions  . Amoxicillin Hives  . Erythromycin Nausea And Vomiting    Past Surgical History:  Procedure Laterality Date  . NO PAST SURGERIES      Social History   Tobacco Use  . Smoking status: Former Games developer  . Smokeless tobacco: Never Used  . Tobacco comment: social in college  Substance Use Topics  . Alcohol use: Yes    Alcohol/week: 0.0 standard drinks    Comment: occasionally  . Drug use: No     Medication list has been reviewed and updated.  Current Meds  Medication Sig  . Riboflavin (VITAMIN B-2 PO) Take 1 tablet by mouth daily.  . sildenafil (VIAGRA) 100 MG tablet Take 1 tablet (100 mg total) by mouth daily as needed for erectile dysfunction.  . thiamine (VITAMIN B-1) 100 MG tablet Take 100 mg by mouth daily.  . [DISCONTINUED] sildenafil (VIAGRA) 100 MG tablet Take 1 tablet (100 mg total) by mouth daily as needed for erectile dysfunction.    PHQ 2/9 Scores 03/14/2018 02/10/2017  PHQ - 2 Score 0 1  PHQ- 9 Score 0 1    Physical Exam  Constitutional: He is oriented to person, place, and time. Vital signs are normal. He appears well-developed and well-nourished.  HENT:  Head: Normocephalic.  Right Ear: Hearing, tympanic membrane, external ear and ear canal normal.  Left Ear: Hearing, tympanic membrane, external ear and ear canal normal.  Nose: Nose normal.  Mouth/Throat: Uvula is midline and oropharynx is clear and moist. No oropharyngeal exudate, posterior oropharyngeal edema or posterior oropharyngeal erythema.  Eyes: Pupils are equal, round, and reactive to light. Conjunctivae, EOM and lids are normal. Right eye exhibits no discharge. Left eye exhibits no discharge. Right conjunctiva is not injected. Left conjunctiva is not injected. No scleral icterus.  Fundoscopic exam:      The right  eye shows no AV nicking.       The left eye shows no AV nicking.    Neck: Trachea normal and normal range of motion. Neck supple. Normal carotid pulses, no hepatojugular reflux and no JVD present. Carotid bruit is not present. No tracheal deviation present. No thyroid mass and no thyromegaly present.  Cardiovascular: Normal rate, regular rhythm, S1 normal, S2 normal, normal heart sounds and intact distal pulses. Exam reveals no gallop, no S3, no S4, no distant heart sounds and no friction rub.  No murmur heard.  No systolic murmur is present.  No diastolic murmur is present. Pulmonary/Chest: Effort normal and breath sounds normal. No respiratory distress. He has no decreased breath sounds. He has no wheezes. He has no rhonchi. He has no rales. Right breast exhibits no mass. Left breast exhibits no mass.  Abdominal: Soft. Normal aorta and bowel sounds are normal. He exhibits no mass. There is no hepatosplenomegaly. There is no tenderness. There is no rigidity, no rebound, no guarding and no CVA tenderness. Hernia confirmed negative in the right inguinal area and confirmed negative in the left inguinal area.  Genitourinary: Rectum normal, prostate normal, testes normal and penis normal. Rectal exam shows no mass, no tenderness and guaiac negative stool. Prostate is not enlarged and not tender. Circumcised.  Musculoskeletal: Normal range of motion. He exhibits no edema or tenderness.  Lymphadenopathy:       Head (right side): No submandibular adenopathy present.       Head (left side): No submandibular adenopathy present.    He has no cervical adenopathy.       Right cervical: No superficial cervical adenopathy present.      Left cervical: No superficial cervical adenopathy present.    He has no axillary adenopathy.  Neurological: He is alert and oriented to person, place, and time. He has normal strength. No cranial nerve deficit or sensory deficit.  Reflex Scores:      Tricep reflexes are 3+ on the right side and 3+ on the left side.      Bicep reflexes are 3+ on  the right side and 3+ on the left side.      Brachioradialis reflexes are 3+ on the right side and 3+ on the left side.      Patellar reflexes are 3+ on the right side and 3+ on the left side.      Achilles reflexes are 3+ on the right side and 3+ on the left side. Skin: Skin is warm, dry and intact. No rash noted. He is not diaphoretic. No pallor.  Nursing note and vitals reviewed.   BP 102/62   Pulse 64   Ht 5\' 10"  (1.778 m)   Wt 151 lb (68.5 kg)   BMI 21.67 kg/m   Assessment and Plan:  1. Erectile dysfunction, unspecified erectile dysfunction type Refill viagra/ gave info on alternative med if wanted to try to get it cheaper. - sildenafil (VIAGRA) 100 MG tablet; Take 1 tablet (100 mg total) by mouth daily as needed for erectile dysfunction.  Dispense: 4 tablet; Refill: 11  2. Annual physical exam Patrick Fuller is a 52 y.o. male who presents today for his Complete Annual Exam. He feels  WELL. He reports exercising. He reports he is sleeping WELL. Draw a lipid and renal - Lipid panel - Renal Function Panel - POCT occult blood stool  3. Flu vaccine need administer - Flu Vaccine QUAD 6+ mos PF IM (Fluarix Quad PF)  4.  Colon cancer screening Negative guaiac - POCT occult blood stool   Dr. Elizabeth Sauer [facility]  03/14/2018

## 2018-03-15 LAB — RENAL FUNCTION PANEL
Albumin: 4.8 g/dL (ref 3.5–5.5)
BUN / CREAT RATIO: 12 (ref 9–20)
BUN: 9 mg/dL (ref 6–24)
CALCIUM: 9.3 mg/dL (ref 8.7–10.2)
CHLORIDE: 102 mmol/L (ref 96–106)
CO2: 27 mmol/L (ref 20–29)
Creatinine, Ser: 0.78 mg/dL (ref 0.76–1.27)
GFR calc Af Amer: 123 mL/min/{1.73_m2} (ref >59)
GFR calc non Af Amer: 106 mL/min/{1.73_m2} (ref >59)
Glucose: 87 mg/dL (ref 65–99)
Phosphorus: 2.9 mg/dL (ref 2.5–4.5)
Potassium: 4.6 mmol/L (ref 3.5–5.2)
Sodium: 142 mmol/L (ref 134–144)

## 2018-03-15 LAB — LIPID PANEL
CHOLESTEROL TOTAL: 171 mg/dL (ref 100–199)
Chol/HDL Ratio: 4.2 ratio (ref 0.0–5.0)
HDL: 41 mg/dL (ref >39)
LDL CALC: 114 mg/dL — AB (ref 0–99)
Triglycerides: 78 mg/dL (ref 0–149)
VLDL Cholesterol Cal: 16 mg/dL (ref 5–40)

## 2018-04-04 ENCOUNTER — Other Ambulatory Visit: Payer: Self-pay

## 2018-04-04 ENCOUNTER — Ambulatory Visit: Payer: BLUE CROSS/BLUE SHIELD | Admitting: Urology

## 2018-04-04 ENCOUNTER — Encounter: Payer: Self-pay | Admitting: Urology

## 2018-04-04 VITALS — BP 113/79 | HR 73 | Ht 70.0 in | Wt 148.6 lb

## 2018-04-04 DIAGNOSIS — Z3009 Encounter for other general counseling and advice on contraception: Secondary | ICD-10-CM | POA: Diagnosis not present

## 2018-04-04 MED ORDER — DIAZEPAM 5 MG PO TABS: 5.0000 mg | ORAL_TABLET | Freq: Once | ORAL | 0 refills | Status: DC | PRN

## 2018-04-04 NOTE — Progress Notes (Signed)
04/04/2018 5:14 PM   Patrick Fuller 1965-10-31 [id number]  Referring provider: Duanne Limerick, MD [address] [place], [place] [address]  CC: Discuss vasectomy  HPI: I the pleasure of meeting Patrick Fuller in urology clinic today to discuss vasectomy.  He is followed by Patrick Fuller in our clinic for erectile dysfunction.  He has 2 teenage boys that are identical twins.  He denies any gross hematuria or flank pain.  He has a family history of prostate cancer, and does get his PSA checked yearly.  Last PSA in our system is February 2018, and was reassuring at 0.8.   PMH: Past Medical History:  Diagnosis Date  . ED (erectile dysfunction)   . Heart murmur     Surgical History: Past Surgical History:  Procedure Laterality Date  . NO PAST SURGERIES      Allergies:  Allergies  Allergen Reactions  . Amoxicillin Hives  . Erythromycin Nausea And Vomiting    Family History: Family History  Problem Relation Age of Onset  . Multiple sclerosis Mother   . Prostate cancer Father   . Kidney cancer Neg Hx   . Bladder Cancer Neg Hx     Social History:  reports that he has quit smoking. He has never used smokeless tobacco. He reports that he drinks alcohol. He reports that he does not use drugs.  ROS: Please see flowsheet from today's date for complete review of systems.  Physical Exam: BP 113/79   Pulse 73   Ht 5\' 10"  (1.778 m)   Wt 148 lb 9.6 oz (67.4 kg)   BMI 21.32 kg/m    Constitutional:  Alert and oriented, No acute distress. Cardiovascular: No clubbing, cyanosis, or edema. Respiratory: Normal respiratory effort, no increased work of breathing. GI: Abdomen is soft, nontender, nondistended, no abdominal masses GU: No CVA tenderness, phallus without lesions, widely patent meatus.  2 cm right spermatocele, nontender.  Vas deferens very easily palpable bilaterally. Lymph: No cervical or inguinal lymphadenopathy. Skin: No rashes, bruises or  suspicious lesions. Neurologic: Grossly intact, no focal deficits, moving all 4 extremities. Psychiatric: Normal mood and affect.  Laboratory Data: None to review  Pertinent Imaging: I have personally reviewed the scrotal ultrasound dated 08/30/2017.  No testicular masses, small 2 cm right spermatocele.  Assessment & Plan:   In summary, Patrick Fuller is a healthy 52 year old male with 2 teenage boys that desires vasectomy.  We discussed the risks and benefits of vasectomy at length.  Vasectomy is intended to be a permanent form of contraception, and does not produce immediate sterility.  Following vasectomy another form of contraception is required until vas occlusion is confirmed by a post-vasectomy semen analysis obtained 2-3 months after the procedure.  Even after vas occlusion is confirmed, vasectomy is not 100% reliable in preventing pregnancy, and the failure rate is approximately 05/1998.  Repeat vasectomy is required in less than 1% of patients.  He should refrain from ejaculation for 1 week after vasectomy.  Options for fertility after vasectomy include vasectomy reversal, and sperm retrieval with in vitro fertilization or ICSI.  These options are not always successful and may be expensive.  Finally, there are other permanent and non-permanent alternatives to vasectomy available. There is no risk of erectile dysfunction, and the volume of semen will be similar to prior, as the majority of the ejaculate is from the prostate and seminal vesicles.   The procedure takes ~20 minutes.  We recommend patients take 5-10 mg of Valium  30 minutes prior, and he will need a driver post-procedure.  Local anesthetic is injected into the scrotal skin and a small segment of the vas deferens is removed, and the ends occluded. The complication rate is approximately 1-2%, and includes bleeding, infection, and development of chronic scrotal pain.  PLAN: Pending insurance approval, will schedule for vasectomy at his  convenience   15 minutes were spent with the patient, greater than 50% were spent in direct face-to-face patient education and counseling regarding vasectomy and prostate cancer.  Sondra Come, MD  [facility] [address], [address] [place], [place] [address] [phone]

## 2018-06-02 ENCOUNTER — Encounter: Payer: BLUE CROSS/BLUE SHIELD | Admitting: Urology

## 2018-06-06 ENCOUNTER — Encounter: Payer: Self-pay | Admitting: Urology

## 2018-06-06 ENCOUNTER — Ambulatory Visit: Payer: BLUE CROSS/BLUE SHIELD | Admitting: Urology

## 2018-06-06 VITALS — BP 103/65 | HR 94 | Ht 70.0 in | Wt 150.0 lb

## 2018-06-06 DIAGNOSIS — Z302 Encounter for sterilization: Secondary | ICD-10-CM | POA: Diagnosis not present

## 2018-06-06 NOTE — Patient Instructions (Signed)

## 2018-06-06 NOTE — Progress Notes (Signed)
VASECTOMY PROCEDURE NOTE:  The patient was taken to the minor procedure room and placed in the supine position. His genitals were prepped and draped in the usual sterile fashion. The right vas deferens was brought up to the skin of the right upper scrotum. The skin overlying it was anesthetized with 1% lidocaine without epinephrine, anesthetic was also injected alongside the vas deferens in the direction of the inguinal canal. The no scalpel vasectomy instrument was used to make a small perforation in the scrotal skin. The vasectomy clamp was used to grasp the vas deferens. It was carefully dissected free from surrounding structures. A 1cm segment of the vas was removed, and the cut ends of the mucosa were cauterized. No significant bleeding was noted. The vas deferens was returned to the scrotum. The skin incision was closed with a simple interrupted stitch of 4-0 chromic.  Attention was then turned to the left side. The left vasectomy was performed in the same exact fashion. A figure of eight chromic suture was used for hemostasis. Sterile dressings were placed over each incision. The patient tolerated the procedure well.  IMPRESSION/DIAGNOSIS: The patient is a 53 year old gentleman who underwent a vasectomy today. Post-procedure instructions were reviewed. I stressed the importance of continuing to use birth control until he provides a semen specimen more than 2 months from now that demonstrates azoospermia.  We discussed return precautions including fever over 101, significant bleeding or hematoma, or uncontrolled pain. I also stressed the importance of avoiding strenuous activity for one week, no sexual activity or ejaculations for 5 days, intermittent icing over the next 48 hours, and scrotal support.   PLAN: The patient will be advised of his semen analysis results when available.  Legrand Rams, MD 06/06/2018

## 2018-08-18 ENCOUNTER — Ambulatory Visit: Payer: BLUE CROSS/BLUE SHIELD | Admitting: Urology

## 2018-09-05 ENCOUNTER — Other Ambulatory Visit: Payer: BLUE CROSS/BLUE SHIELD

## 2018-09-06 ENCOUNTER — Other Ambulatory Visit: Payer: Self-pay

## 2018-09-06 DIAGNOSIS — Z3009 Encounter for other general counseling and advice on contraception: Secondary | ICD-10-CM

## 2018-09-07 ENCOUNTER — Other Ambulatory Visit: Payer: BLUE CROSS/BLUE SHIELD

## 2018-09-07 ENCOUNTER — Other Ambulatory Visit: Payer: Self-pay

## 2018-09-07 DIAGNOSIS — Z3009 Encounter for other general counseling and advice on contraception: Secondary | ICD-10-CM

## 2018-09-08 ENCOUNTER — Telehealth: Payer: Self-pay

## 2018-09-08 LAB — POST-VAS SPERM EVALUATION,QUAL: Volume: 5.2 mL

## 2018-09-08 NOTE — Telephone Encounter (Signed)
.  mychart

## 2018-09-08 NOTE — Telephone Encounter (Signed)
-----   Message from Sondra Come, MD sent at 09/08/2018 11:59 AM EDT ----- No sperm seen, ok to stop using alternative contraception  Legrand Rams, MD 09/08/2018

## 2019-03-20 ENCOUNTER — Other Ambulatory Visit: Payer: Self-pay | Admitting: Family Medicine

## 2019-03-20 DIAGNOSIS — N529 Male erectile dysfunction, unspecified: Secondary | ICD-10-CM

## 2019-03-23 ENCOUNTER — Ambulatory Visit (INDEPENDENT_AMBULATORY_CARE_PROVIDER_SITE_OTHER): Payer: BC Managed Care – PPO | Admitting: Family Medicine

## 2019-03-23 ENCOUNTER — Other Ambulatory Visit: Payer: Self-pay

## 2019-03-23 ENCOUNTER — Encounter: Payer: Self-pay | Admitting: Family Medicine

## 2019-03-23 VITALS — BP 110/80 | HR 60 | Ht 70.0 in | Wt 148.0 lb

## 2019-03-23 DIAGNOSIS — Z8042 Family history of malignant neoplasm of prostate: Secondary | ICD-10-CM

## 2019-03-23 DIAGNOSIS — Z0000 Encounter for general adult medical examination without abnormal findings: Secondary | ICD-10-CM | POA: Diagnosis not present

## 2019-03-23 DIAGNOSIS — E7800 Pure hypercholesterolemia, unspecified: Secondary | ICD-10-CM

## 2019-03-23 DIAGNOSIS — N529 Male erectile dysfunction, unspecified: Secondary | ICD-10-CM | POA: Diagnosis not present

## 2019-03-23 DIAGNOSIS — Z1211 Encounter for screening for malignant neoplasm of colon: Secondary | ICD-10-CM

## 2019-03-23 MED ORDER — SILDENAFIL CITRATE 100 MG PO TABS: 100.0000 mg | ORAL_TABLET | Freq: Every day | ORAL | 11 refills | Status: DC | PRN

## 2019-03-23 NOTE — Patient Instructions (Signed)

## 2019-03-23 NOTE — Progress Notes (Signed)
Date:  03/23/2019   Name:  Patrick Fuller   DOB:  03-Dec-1965   MRN:  [id number]   Chief Complaint: Annual Exam (referral for colonoscopy- no issues)  Patient is a 53 year old male who presents for a comprehensive physical exam. The patient reports the following problems: vertigo/ENT . Health maintenance has been reviewed needs colonoscopy.   Review of Systems  Constitutional: Negative for chills, fatigue and fever.  HENT: Positive for tinnitus. Negative for drooling, ear discharge, ear pain, hearing loss and sore throat.   Respiratory: Negative for cough, shortness of breath and wheezing.   Cardiovascular: Positive for palpitations. Negative for chest pain and leg swelling.  Gastrointestinal: Negative for abdominal pain, blood in stool, constipation, diarrhea and nausea.  Endocrine: Negative for polydipsia.  Genitourinary: Negative for dysuria, frequency, hematuria and urgency.  Musculoskeletal: Negative for back pain, myalgias and neck pain.  Skin: Negative for rash.  Allergic/Immunologic: Negative for environmental allergies.  Neurological: Positive for light-headedness. Negative for dizziness and headaches.  Hematological: Does not bruise/bleed easily.  Psychiatric/Behavioral: Negative for suicidal ideas. The patient is not nervous/anxious.     Patient Active Problem List   Diagnosis Date Noted  . Vasectomy evaluation 04/04/2018    Allergies  Allergen Reactions  . Amoxicillin Hives  . Erythromycin Nausea And Vomiting    Past Surgical History:  Procedure Laterality Date  . NO PAST SURGERIES      Social History   Tobacco Use  . Smoking status: Former Research scientist (life sciences)  . Smokeless tobacco: Never Used  . Tobacco comment: social in college  Substance Use Topics  . Alcohol use: Yes    Alcohol/week: 0.0 standard drinks    Comment: occasionally  . Drug use: No     Medication list has been reviewed and updated.  Current Meds  Medication Sig  . Riboflavin  (VITAMIN B-2 PO) Take 1 tablet by mouth daily.  . sildenafil (VIAGRA) 100 MG tablet Take 1 tablet (100 mg total) by mouth daily as needed for erectile dysfunction.  . thiamine (VITAMIN B-1) 100 MG tablet Take 100 mg by mouth daily.    PHQ 2/9 Scores 03/23/2019 03/14/2018 02/10/2017  PHQ - 2 Score 0 0 1  PHQ- 9 Score 0 0 1    BP Readings from Last 3 Encounters:  03/23/19 110/80  06/06/18 103/65  04/04/18 113/79    Physical Exam Vitals signs and nursing note reviewed.  Constitutional:      Appearance: Normal appearance. He is well-developed, well-groomed and normal weight.  HENT:     Head: Normocephalic.     Jaw: There is normal jaw occlusion.     Right Ear: Hearing, tympanic membrane, ear canal and external ear normal.     Left Ear: Hearing, tympanic membrane, ear canal and external ear normal.     Nose: Nose normal.     Mouth/Throat:     Lips: Pink.     Mouth: Mucous membranes are moist.     Pharynx: Oropharynx is clear.  Eyes:     General: Lids are normal. Vision grossly intact. Gaze aligned appropriately. No scleral icterus.       Right eye: No discharge.        Left eye: No discharge.     Conjunctiva/sclera: Conjunctivae normal.     Pupils: Pupils are equal, round, and reactive to light.     Funduscopic exam:    Right eye: Red reflex present.        Left eye: Red  reflex present. Neck:     Musculoskeletal: Full passive range of motion without pain, normal range of motion and neck supple.     Thyroid: No thyroid mass, thyromegaly or thyroid tenderness.     Vascular: No carotid bruit, hepatojugular reflux or JVD.     Trachea: Trachea and phonation normal. No tracheal deviation.  Cardiovascular:     Rate and Rhythm: Normal rate and regular rhythm.     Chest Wall: PMI is not displaced.     Pulses: Normal pulses.     Heart sounds: Normal heart sounds, S1 normal and S2 normal. No murmur. No systolic murmur. No diastolic murmur. No friction rub. No gallop. No S3 or S4  sounds.   Pulmonary:     Effort: Pulmonary effort is normal. No respiratory distress.     Breath sounds: Normal breath sounds. No decreased breath sounds, wheezing, rhonchi or rales.  Chest:     Breasts: Breasts are symmetrical.        Right: Normal.        Left: Normal.  Abdominal:     General: Bowel sounds are normal.     Palpations: Abdomen is soft. There is no hepatomegaly, splenomegaly or mass.     Tenderness: There is no abdominal tenderness. There is no right CVA tenderness, left CVA tenderness, guarding or rebound.  Genitourinary:    Penis: Normal.      Scrotum/Testes: Normal.        Right: Mass not present.        Left: Mass not present.     Epididymis:     Right: Normal.     Left: Normal.     Prostate: Normal. Not enlarged, not tender and no nodules present.     Rectum: Normal. Guaiac result negative. No mass.  Musculoskeletal: Normal range of motion.        General: No tenderness.     Cervical back: Normal.     Thoracic back: Normal.     Lumbar back: Normal.     Right lower leg: No edema.     Left lower leg: No edema.  Feet:     Right foot:     Skin integrity: Skin integrity normal.     Left foot:     Skin integrity: Skin integrity normal.  Lymphadenopathy:     Head:     Right side of head: No submental, submandibular or tonsillar adenopathy.     Left side of head: No submental, submandibular or tonsillar adenopathy.     Cervical: No cervical adenopathy.     Right cervical: No superficial, deep or posterior cervical adenopathy.    Left cervical: No superficial, deep or posterior cervical adenopathy.     Upper Body:     Right upper body: No supraclavicular, axillary or pectoral adenopathy.     Left upper body: No supraclavicular, axillary or pectoral adenopathy.     Lower Body: No right inguinal adenopathy. No left inguinal adenopathy.  Skin:    General: Skin is warm.     Capillary Refill: Capillary refill takes less than 2 seconds.     Findings: No rash.   Neurological:     General: No focal deficit present.     Mental Status: He is alert and oriented to person, place, and time.     Cranial Nerves: Cranial nerves are intact. No cranial nerve deficit.     Sensory: Sensation is intact.     Motor: Motor function is intact.  Deep Tendon Reflexes: Reflexes are normal and symmetric.     Reflex Scores:      Tricep reflexes are 2+ on the right side and 2+ on the left side.      Bicep reflexes are 2+ on the right side and 2+ on the left side.      Brachioradialis reflexes are 2+ on the right side and 2+ on the left side.      Patellar reflexes are 2+ on the right side and 2+ on the left side.      Achilles reflexes are 2+ on the right side and 2+ on the left side. Psychiatric:        Attention and Perception: Attention normal.        Mood and Affect: Mood and affect normal.        Speech: Speech normal.        Behavior: Behavior normal. Behavior is cooperative.        Cognition and Memory: Cognition normal.     Wt Readings from Last 3 Encounters:  03/23/19 148 lb (67.1 kg)  06/06/18 150 lb (68 kg)  04/04/18 148 lb 9.6 oz (67.4 kg)    BP 110/80   Pulse 60   Ht 5\' 10"  (1.778 m)   Wt 148 lb (67.1 kg)   BMI 21.24 kg/m   Assessment and Plan: 1. Erectile dysfunction, unspecified erectile dysfunction type Patient with history of ET controlled on sildenafil 100 mg as needed. - sildenafil (VIAGRA) 100 MG tablet; Take 1 tablet (100 mg total) by mouth daily as needed for erectile dysfunction.  Dispense: 4 tablet; Refill: 11  2. Annual physical exam No subjective/objective concerns noted during the history and physical exam.  Patient's previous encounters labs and imaging were reviewed.JURGEN GROENEVELD is a 53 y.o. male who presents today for his Complete Annual Exam. He feels well. He reports exercising . He reports he is sleeping well. Immunizations are reviewed and recommendations provided.   Age appropriate screening tests are  discussed. Counseling given for risk factor reduction interventions. - Renal function panel - Lipid Panel With LDL/HDL Ratio - PSA  3. Pure hypercholesterolemia Patient has had elevated cholesterol in the past we will check a lipid panel and patient was given a low-cholesterol diet. - Lipid Panel With LDL/HDL Ratio  4. Family history of prostate cancer Patient has a family history that his father had prostatic cancer.  We will check a PSA.  DRE today was noted to be normal. - PSA  5. Colon cancer screening Guaiac was noted to be negative and patient was scheduled for gastroenterology appointment for scheduling of colonoscopy. - Ambulatory referral to Gastroenterology

## 2019-03-24 LAB — RENAL FUNCTION PANEL
Albumin: 4.7 g/dL (ref 4.0–5.0)
BUN/Creatinine Ratio: 19 (ref 9–20)
BUN: 13 mg/dL (ref 6–24)
CO2: 26 mmol/L (ref 20–29)
Calcium: 9.3 mg/dL (ref 8.7–10.2)
Chloride: 104 mmol/L (ref 96–106)
Creatinine, Ser: 0.69 mg/dL — ABNORMAL LOW (ref 0.76–1.27)
GFR calc Af Amer: 128 mL/min/{1.73_m2} (ref >59)
GFR calc non Af Amer: 111 mL/min/{1.73_m2} (ref >59)
Glucose: 85 mg/dL (ref 65–99)
Phosphorus: 2.9 mg/dL (ref 2.8–4.1)
Potassium: 4.5 mmol/L (ref 3.5–5.2)
Sodium: 142 mmol/L (ref 134–144)

## 2019-03-24 LAB — PSA: Prostate Specific Ag, Serum: 0.7 ng/mL (ref 0.0–4.0)

## 2019-03-24 LAB — LIPID PANEL WITH LDL/HDL RATIO
Cholesterol, Total: 191 mg/dL (ref 100–199)
HDL: 40 mg/dL (ref >39)
LDL Chol Calc (NIH): 137 mg/dL — ABNORMAL HIGH (ref 0–99)
LDL/HDL Ratio: 3.4 ratio (ref 0.0–3.6)
Triglycerides: 73 mg/dL (ref 0–149)
VLDL Cholesterol Cal: 14 mg/dL (ref 5–40)

## 2019-04-06 ENCOUNTER — Encounter: Payer: Self-pay | Admitting: *Deleted

## 2019-06-29 ENCOUNTER — Encounter: Payer: Self-pay | Admitting: Family Medicine

## 2019-06-30 ENCOUNTER — Other Ambulatory Visit: Payer: Self-pay

## 2019-06-30 DIAGNOSIS — Z1211 Encounter for screening for malignant neoplasm of colon: Secondary | ICD-10-CM

## 2019-06-30 NOTE — Progress Notes (Unsigned)
Referral to [facility]

## 2019-08-21 LAB — HM COLONOSCOPY

## 2019-12-29 ENCOUNTER — Encounter: Payer: Self-pay | Admitting: Family Medicine

## 2020-07-08 ENCOUNTER — Encounter: Payer: Self-pay | Admitting: Family Medicine

## 2020-07-08 ENCOUNTER — Other Ambulatory Visit: Payer: Self-pay

## 2020-07-08 ENCOUNTER — Ambulatory Visit: Payer: BC Managed Care – PPO | Admitting: Family Medicine

## 2020-07-08 VITALS — BP 120/80 | HR 80 | Ht 70.0 in | Wt 142.0 lb

## 2020-07-08 DIAGNOSIS — R1032 Left lower quadrant pain: Secondary | ICD-10-CM

## 2020-07-08 LAB — HEMOCCULT GUIAC POC 1CARD (OFFICE): Fecal Occult Blood, POC: NEGATIVE

## 2020-07-08 LAB — POCT URINALYSIS DIPSTICK
Bilirubin, UA: NEGATIVE
Blood, UA: NEGATIVE
Glucose, UA: NEGATIVE
Ketones, UA: NEGATIVE
Leukocytes, UA: NEGATIVE
Nitrite, UA: NEGATIVE
Protein, UA: NEGATIVE
Spec Grav, UA: <=1.005 — AB (ref 1.010–1.025)
Urobilinogen, UA: 0.2 E.U./dL
pH, UA: 5 (ref 5.0–8.0)

## 2020-07-08 MED ORDER — CIPROFLOXACIN HCL 500 MG PO TABS: 500.0000 mg | ORAL_TABLET | Freq: Two times a day (BID) | ORAL | 0 refills | Status: DC

## 2020-07-08 MED ORDER — METRONIDAZOLE 500 MG PO TABS: 500.0000 mg | ORAL_TABLET | Freq: Two times a day (BID) | ORAL | 0 refills | Status: DC

## 2020-07-08 NOTE — Progress Notes (Addendum)
Date:  07/08/2020   Name:  Patrick Fuller   DOB:  [birth year]   MRN:  [id number]   Chief Complaint: Abdominal Pain (LUQ pain x 2 months- doesn't make him "double over in pain", but is still there)  Abdominal Pain This is a new problem. The current episode started more than 1 month ago ( 2 months). The problem occurs intermittently. The problem has been unchanged. The pain is located in the LUQ and LLQ. The pain is at a severity of 3/10. The pain is mild. The quality of the pain is aching. Pertinent negatives include no anorexia, arthralgias, constipation, diarrhea, dysuria, fever, flatus, frequency, headaches, hematochezia, hematuria, melena, myalgias, nausea, vomiting or weight loss. Nothing aggravates the pain. The pain is relieved by being still and recumbency. He has tried acetaminophen and antacids for the symptoms.    Lab Results  Component Value Date   CREATININE 0.69 (L) 03/23/2019   BUN 13 03/23/2019   NA 142 03/23/2019   K 4.5 03/23/2019   CL 104 03/23/2019   CO2 26 03/23/2019   Lab Results  Component Value Date   CHOL 191 03/23/2019   HDL 40 03/23/2019   LDLCALC 137 (H) 03/23/2019   TRIG 73 03/23/2019   CHOLHDL 4.2 03/14/2018   Lab Results  Component Value Date   TSH 1.107 10/23/2016   No results found for: HGBA1C No results found for: WBC, HGB, HCT, MCV, PLT No results found for: ALT, AST, GGT, ALKPHOS, BILITOT   Review of Systems  Constitutional: Negative for chills, fever and weight loss.  HENT: Negative for drooling, ear discharge, ear pain and sore throat.   Respiratory: Negative for cough, shortness of breath and wheezing.   Cardiovascular: Negative for chest pain, palpitations and leg swelling.  Gastrointestinal: Positive for abdominal pain. Negative for anorexia, blood in stool, constipation, diarrhea, flatus, hematochezia, melena, nausea and vomiting.  Endocrine: Negative for polydipsia.  Genitourinary: Negative for dysuria, frequency,  hematuria and urgency.  Musculoskeletal: Negative for arthralgias, back pain, myalgias and neck pain.  Skin: Negative for rash.  Allergic/Immunologic: Negative for environmental allergies.  Neurological: Negative for dizziness and headaches.  Hematological: Does not bruise/bleed easily.  Psychiatric/Behavioral: Negative for suicidal ideas. The patient is not nervous/anxious.     Patient Active Problem List   Diagnosis Date Noted  . Vasectomy evaluation 04/04/2018    Allergies  Allergen Reactions  . Amoxicillin Hives  . Erythromycin Nausea And Vomiting    Past Surgical History:  Procedure Laterality Date  . NO PAST SURGERIES      Social History   Tobacco Use  . Smoking status: Former Games developer  . Smokeless tobacco: Never Used  . Tobacco comment: social in college  Substance Use Topics  . Alcohol use: Yes    Alcohol/week: 0.0 standard drinks    Comment: occasionally  . Drug use: No     Medication list has been reviewed and updated.  No outpatient medications have been marked as taking for the 07/08/20 encounter (Office Visit) with Duanne Limerick, MD.    [facility] 2/9 Scores 07/08/2020 03/23/2019 03/14/2018 02/10/2017  PHQ - 2 Score 0 0 0 1  PHQ- 9 Score 0 0 0 1    GAD 7 : Generalized Anxiety Score 07/08/2020  Nervous, Anxious, on Edge 1  Control/stop worrying 1  Worry too much - different things 0  Trouble relaxing 0  Restless 0  Easily annoyed or irritable 0  Afraid - awful might happen 0  Total  GAD 7 Score 2  Anxiety Difficulty Not difficult at all    BP Readings from Last 3 Encounters:  07/08/20 120/80  03/23/19 110/80  06/06/18 103/65    Physical Exam Vitals and nursing note reviewed.  HENT:     Head: Normocephalic.     Right Ear: External ear normal.     Left Ear: External ear normal.     Nose: Nose normal.     Mouth/Throat:     Mouth: Oropharynx is clear and moist. Mucous membranes are moist.  Eyes:     General: No scleral icterus.       Right  eye: No discharge.        Left eye: No discharge.     Extraocular Movements: EOM normal.     Conjunctiva/sclera: Conjunctivae normal.     Pupils: Pupils are equal, round, and reactive to light.  Neck:     Thyroid: No thyromegaly.     Vascular: No JVD.     Trachea: No tracheal deviation.  Cardiovascular:     Rate and Rhythm: Normal rate and regular rhythm.     Pulses: Intact distal pulses.     Heart sounds: Normal heart sounds. No murmur heard. No friction rub. No gallop.   Pulmonary:     Effort: No respiratory distress.     Breath sounds: Normal breath sounds. No wheezing or rales.  Abdominal:     General: Abdomen is flat. Bowel sounds are normal.     Palpations: Abdomen is soft. There is no hepatomegaly, splenomegaly, hepatosplenomegaly or mass.     Tenderness: There is abdominal tenderness in the left lower quadrant. There is no CVA tenderness, right CVA tenderness, left CVA tenderness, guarding or rebound.     Comments: Negative Carnett's sign  Genitourinary:    Rectum: Normal. Guaiac result negative. No mass or tenderness.  Musculoskeletal:        General: No tenderness or edema. Normal range of motion.     Cervical back: Normal range of motion and neck supple.  Lymphadenopathy:     Cervical: No cervical adenopathy.  Skin:    General: Skin is warm.     Findings: No rash.  Neurological:     Mental Status: He is alert and oriented to person, place, and time.     Cranial Nerves: No cranial nerve deficit.     Deep Tendon Reflexes: Strength normal and reflexes are normal and symmetric.     Wt Readings from Last 3 Encounters:  07/08/20 142 lb (64.4 kg)  03/23/19 148 lb (67.1 kg)  06/06/18 150 lb (68 kg)    BP 120/80   Pulse 80   Ht 5\' 10"  (1.778 m)   Wt 142 lb (64.4 kg)   BMI 20.37 kg/m   Assessment and Plan: 1. Left lower quadrant abdominal pain New onset.  Persistent.  Patient has this intermittent pain and is not constant primarily in the left lower quadrant  without radiation and this is been going on for about 2 months.  Sign is negative.  Review of patient's colonoscopy was unremarkable except for internal hemorrhoids.  Even though there was no diverticula noted sometimes these can be small and may be not recognizable and given the duration and location of the pain I will start him on Cipro 500 mg twice a day and metronidazole 500 mg twice a day for 7 days.  We will check a CBC for white count and for baseline prior to antibiotic initiation as well as  check a urinalysis and Hemoccult.  Will recheck in 5 to 7 days. - CBC with Differential/Platelet - ciprofloxacin (CIPRO) 500 MG tablet; Take 1 tablet (500 mg total) by mouth 2 (two) times daily.  Dispense: 14 tablet; Refill: 0 - metroNIDAZOLE (FLAGYL) 500 MG tablet; Take 1 tablet (500 mg total) by mouth 2 (two) times daily.  Dispense: 14 tablet; Refill: 0 - POCT occult blood stool - POCT urinalysis dipstick

## 2020-07-09 LAB — CBC WITH DIFFERENTIAL/PLATELET
Basophils Absolute: 0.1 10*3/uL (ref 0.0–0.2)
Basos: 1 %
EOS (ABSOLUTE): 0.1 10*3/uL (ref 0.0–0.4)
Eos: 2 %
Hematocrit: 45.3 % (ref 37.5–51.0)
Hemoglobin: 15.2 g/dL (ref 13.0–17.7)
Immature Grans (Abs): 0 10*3/uL (ref 0.0–0.1)
Immature Granulocytes: 0 %
Lymphocytes Absolute: 1.2 10*3/uL (ref 0.7–3.1)
Lymphs: 25 %
MCH: 29.7 pg (ref 26.6–33.0)
MCHC: 33.6 g/dL (ref 31.5–35.7)
MCV: 89 fL (ref 79–97)
Monocytes Absolute: 0.4 10*3/uL (ref 0.1–0.9)
Monocytes: 7 %
Neutrophils Absolute: 3.2 10*3/uL (ref 1.4–7.0)
Neutrophils: 65 %
Platelets: 273 10*3/uL (ref 150–450)
RBC: 5.11 x10E6/uL (ref 4.14–5.80)
RDW: 13 % (ref 11.6–15.4)
WBC: 4.9 10*3/uL (ref 3.4–10.8)

## 2020-07-11 ENCOUNTER — Ambulatory Visit: Payer: BC Managed Care – PPO | Admitting: Family Medicine

## 2020-07-11 ENCOUNTER — Encounter: Payer: BC Managed Care – PPO | Admitting: Family Medicine

## 2020-07-15 ENCOUNTER — Encounter: Payer: BC Managed Care – PPO | Admitting: Family Medicine

## 2020-07-15 ENCOUNTER — Ambulatory Visit: Payer: BC Managed Care – PPO | Admitting: Family Medicine

## 2020-07-18 ENCOUNTER — Ambulatory Visit (INDEPENDENT_AMBULATORY_CARE_PROVIDER_SITE_OTHER): Payer: BC Managed Care – PPO | Admitting: Family Medicine

## 2020-07-18 ENCOUNTER — Other Ambulatory Visit: Payer: Self-pay

## 2020-07-18 ENCOUNTER — Encounter: Payer: Self-pay | Admitting: Family Medicine

## 2020-07-18 VITALS — BP 104/60 | HR 60 | Ht 70.0 in | Wt 141.0 lb

## 2020-07-18 DIAGNOSIS — R1032 Left lower quadrant pain: Secondary | ICD-10-CM

## 2020-07-18 DIAGNOSIS — E7800 Pure hypercholesterolemia, unspecified: Secondary | ICD-10-CM | POA: Diagnosis not present

## 2020-07-18 DIAGNOSIS — Z0000 Encounter for general adult medical examination without abnormal findings: Secondary | ICD-10-CM

## 2020-07-18 DIAGNOSIS — N529 Male erectile dysfunction, unspecified: Secondary | ICD-10-CM

## 2020-07-18 DIAGNOSIS — Z8042 Family history of malignant neoplasm of prostate: Secondary | ICD-10-CM

## 2020-07-18 MED ORDER — SILDENAFIL CITRATE 100 MG PO TABS: 100.0000 mg | ORAL_TABLET | Freq: Every day | ORAL | 11 refills | Status: DC | PRN

## 2020-07-18 NOTE — Progress Notes (Signed)
Date:  07/18/2020   Name:  Patrick Fuller   DOB:  1965-07-18   MRN:  [id number]   Chief Complaint: Annual Exam (Still having pain in L) side after completing Flagyl and Cipro)  Patient is a 55 year old male who presents for a comprehensive physical exam. The patient reports the following problems: none. Health maintenance has been reviewed up to date.   Lab Results  Component Value Date   CREATININE 0.69 (L) 03/23/2019   BUN 13 03/23/2019   NA 142 03/23/2019   K 4.5 03/23/2019   CL 104 03/23/2019   CO2 26 03/23/2019   Lab Results  Component Value Date   CHOL 191 03/23/2019   HDL 40 03/23/2019   LDLCALC 137 (H) 03/23/2019   TRIG 73 03/23/2019   CHOLHDL 4.2 03/14/2018   Lab Results  Component Value Date   TSH 1.107 10/23/2016   No results found for: HGBA1C Lab Results  Component Value Date   WBC 4.9 07/08/2020   HGB 15.2 07/08/2020   HCT 45.3 07/08/2020   MCV 89 07/08/2020   PLT 273 07/08/2020   No results found for: ALT, AST, GGT, ALKPHOS, BILITOT   Review of Systems  Constitutional: Negative for chills and fever.  HENT: Negative for drooling, ear discharge, ear pain, nosebleeds, postnasal drip and sore throat.   Respiratory: Negative for cough, shortness of breath and wheezing.   Cardiovascular: Negative for chest pain, palpitations and leg swelling.  Gastrointestinal: Positive for abdominal pain. Negative for abdominal distention, anal bleeding, blood in stool, constipation, diarrhea, nausea, rectal pain and vomiting.  Endocrine: Negative for polydipsia.  Genitourinary: Negative for dysuria, frequency, hematuria and urgency.  Musculoskeletal: Negative for back pain, myalgias and neck pain.  Skin: Negative for rash.  Allergic/Immunologic: Negative for environmental allergies.  Neurological: Negative for dizziness and headaches.  Hematological: Does not bruise/bleed easily.  Psychiatric/Behavioral: Negative for suicidal ideas. The patient is not  nervous/anxious.     Patient Active Problem List   Diagnosis Date Noted  . Vasectomy evaluation 04/04/2018    Allergies  Allergen Reactions  . Amoxicillin Hives  . Erythromycin Nausea And Vomiting    Past Surgical History:  Procedure Laterality Date  . NO PAST SURGERIES      Social History   Tobacco Use  . Smoking status: Former Games developer  . Smokeless tobacco: Never Used  . Tobacco comment: social in college  Substance Use Topics  . Alcohol use: Yes    Alcohol/week: 0.0 standard drinks    Comment: occasionally  . Drug use: No     Medication list has been reviewed and updated.  Current Meds  Medication Sig  . Riboflavin (VITAMIN B-2 PO) Take 1 tablet by mouth daily.  . sildenafil (VIAGRA) 100 MG tablet Take 1 tablet (100 mg total) by mouth daily as needed for erectile dysfunction.  . thiamine (VITAMIN B-1) 100 MG tablet Take 100 mg by mouth daily.    PHQ 2/9 Scores 07/18/2020 07/08/2020 03/23/2019 03/14/2018  PHQ - 2 Score 0 0 0 0  PHQ- 9 Score 0 0 0 0    GAD 7 : Generalized Anxiety Score 07/18/2020 07/08/2020  Nervous, Anxious, on Edge 0 1  Control/stop worrying 0 1  Worry too much - different things 0 0  Trouble relaxing 0 0  Restless 0 0  Easily annoyed or irritable 0 0  Afraid - awful might happen 0 0  Total GAD 7 Score 0 2  Anxiety Difficulty - Not  difficult at all    BP Readings from Last 3 Encounters:  07/18/20 104/60  07/08/20 120/80  03/23/19 110/80    Physical Exam Vitals and nursing note reviewed.  Constitutional:      Appearance: Normal appearance. He is well-developed and well-groomed.  HENT:     Head: Normocephalic.     Jaw: There is normal jaw occlusion.     Salivary Glands: Right salivary gland is not diffusely enlarged. Left salivary gland is not diffusely enlarged.     Right Ear: Hearing, tympanic membrane, ear canal and external ear normal.     Left Ear: Hearing, tympanic membrane, ear canal and external ear normal.     Nose: Nose  normal.     Mouth/Throat:     Lips: Pink.     Mouth: Oropharynx is clear and moist. Mucous membranes are moist.     Pharynx: Oropharynx is clear. Uvula midline.  Eyes:     General: Lids are normal. Vision grossly intact. Gaze aligned appropriately. No scleral icterus.       Right eye: No discharge.        Left eye: No discharge.     Extraocular Movements: Extraocular movements intact and EOM normal.     Conjunctiva/sclera: Conjunctivae normal.     Pupils: Pupils are equal, round, and reactive to light.     Funduscopic exam:    Right eye: Red reflex present.        Left eye: Red reflex present. Neck:     Thyroid: No thyroid mass, thyromegaly or thyroid tenderness.     Vascular: Normal carotid pulses. No carotid bruit, hepatojugular reflux or JVD.     Trachea: Trachea and phonation normal. No tracheal deviation.  Cardiovascular:     Rate and Rhythm: Normal rate and regular rhythm.     Pulses: Normal pulses and intact distal pulses.          Carotid pulses are 2+ on the right side and 2+ on the left side.      Radial pulses are 2+ on the right side and 2+ on the left side.       Femoral pulses are 2+ on the right side and 2+ on the left side.      Popliteal pulses are 2+ on the right side and 2+ on the left side.       Dorsalis pedis pulses are 2+ on the right side and 2+ on the left side.       Posterior tibial pulses are 2+ on the right side and 2+ on the left side.     Heart sounds: Normal heart sounds, S1 normal and S2 normal. No murmur heard.  No systolic murmur is present.  No diastolic murmur is present. No friction rub. No gallop. No S3 or S4 sounds.   Pulmonary:     Effort: Pulmonary effort is normal. No respiratory distress.     Breath sounds: Normal breath sounds. No decreased breath sounds, wheezing, rhonchi or rales.  Chest:  Breasts: Breasts are symmetrical.     Right: Normal. No mass, axillary adenopathy or supraclavicular adenopathy.     Left: Normal. No mass,  axillary adenopathy or supraclavicular adenopathy.    Abdominal:     General: Bowel sounds are normal. There is no abdominal bruit.     Palpations: Abdomen is soft. There is no hepatomegaly, splenomegaly, hepatosplenomegaly or mass.     Tenderness: There is no abdominal tenderness. There is no CVA tenderness, right CVA tenderness,  left CVA tenderness, guarding or rebound.     Hernia: No hernia is present. There is no hernia in the umbilical area, ventral area, left inguinal area or right inguinal area.  Genitourinary:    Penis: Normal and circumcised.      Testes: Normal.        Right: Mass, tenderness, testicular hydrocele or varicocele not present.        Left: Mass, tenderness, testicular hydrocele or varicocele not present.     Epididymis:     Right: Normal.  Musculoskeletal:        General: No tenderness or edema. Normal range of motion.     Cervical back: Full passive range of motion without pain, normal range of motion and neck supple.     Right lower leg: No edema.     Left lower leg: No edema.  Lymphadenopathy:     Head:     Right side of head: No submandibular adenopathy.     Left side of head: No submandibular adenopathy.     Cervical: No cervical adenopathy.     Right cervical: No superficial, deep or posterior cervical adenopathy.    Left cervical: No superficial, deep or posterior cervical adenopathy.     Upper Body:     Right upper body: No supraclavicular or axillary adenopathy.     Left upper body: No supraclavicular or axillary adenopathy.     Lower Body: No right inguinal adenopathy. No left inguinal adenopathy.  Skin:    General: Skin is warm.     Capillary Refill: Capillary refill takes less than 2 seconds.     Findings: No rash.  Neurological:     Mental Status: He is alert and oriented to person, place, and time.     Cranial Nerves: Cranial nerves are intact. No cranial nerve deficit.     Sensory: Sensation is intact.     Motor: Motor function is intact.      Deep Tendon Reflexes: Strength normal and reflexes are normal and symmetric.  Psychiatric:        Behavior: Behavior is cooperative.     Wt Readings from Last 3 Encounters:  07/18/20 141 lb (64 kg)  07/08/20 142 lb (64.4 kg)  03/23/19 148 lb (67.1 kg)    BP 104/60   Pulse 60   Ht 5\' 10"  (1.778 m)   Wt 141 lb (64 kg)   BMI 20.23 kg/m   Assessment and Plan:  1. Annual physical exam No subjective/objective concerns noted during history and physical exam.Patrick Fuller is a 55 y.o. male who presents today for his Complete Annual Exam. He feels well. He reports exercising . He reports he is sleeping well. Immunizations are reviewed and recommendations provided.   Age appropriate screening tests are discussed. Counseling given for risk factor reduction interventions.  Patient's chart was reviewed for previous encounters.  We will check a PSA lipid panel and CMP with today's visit. - PSA - Lipid Panel With LDL/HDL Ratio - Comprehensive metabolic panel  2. Erectile dysfunction, unspecified erectile dysfunction type Chronic.  Controlled.  Stable.  Continue sildenafil 100 mg 1/4-1/2 as needed. - sildenafil (VIAGRA) 100 MG tablet; Take 1 tablet (100 mg total) by mouth daily as needed for erectile dysfunction.  Dispense: 4 tablet; Refill: 11  3. Left lower quadrant abdominal pain New onset.  Persistent.  Patient has a persistent left lower quadrant discomfort without tenderness rebound or guarding.  We will have her back to gastroenterology where he recently  had colonoscopy. - Comprehensive metabolic panel - Ambulatory referral to Gastroenterology  4. Pure hypercholesterolemia Chronic.  Controlled.  Stable.  Patient currently controlling with diet and we will check a lipid panel for current status. - Lipid Panel With LDL/HDL Ratio  5. Family history of prostate cancer Patient with a family history of prostate cancer for which we will check a PSA - Comprehensive metabolic  panel

## 2020-07-19 ENCOUNTER — Other Ambulatory Visit: Payer: Self-pay

## 2020-07-19 DIAGNOSIS — R972 Elevated prostate specific antigen [PSA]: Secondary | ICD-10-CM

## 2020-07-19 DIAGNOSIS — R1032 Left lower quadrant pain: Secondary | ICD-10-CM

## 2020-07-19 DIAGNOSIS — R7989 Other specified abnormal findings of blood chemistry: Secondary | ICD-10-CM

## 2020-07-19 LAB — LIPID PANEL WITH LDL/HDL RATIO
Cholesterol, Total: 165 mg/dL (ref 100–199)
HDL: 50 mg/dL (ref >39)
LDL Chol Calc (NIH): 102 mg/dL — ABNORMAL HIGH (ref 0–99)
LDL/HDL Ratio: 2 ratio (ref 0.0–3.6)
Triglycerides: 66 mg/dL (ref 0–149)
VLDL Cholesterol Cal: 13 mg/dL (ref 5–40)

## 2020-07-19 LAB — COMPREHENSIVE METABOLIC PANEL
ALT: 54 IU/L — ABNORMAL HIGH (ref 0–44)
AST: 44 IU/L — ABNORMAL HIGH (ref 0–40)
Albumin/Globulin Ratio: 2.7 — ABNORMAL HIGH (ref 1.2–2.2)
Albumin: 4.6 g/dL (ref 3.8–4.9)
Alkaline Phosphatase: 35 IU/L — ABNORMAL LOW (ref 44–121)
BUN/Creatinine Ratio: 17 (ref 9–20)
BUN: 13 mg/dL (ref 6–24)
Bilirubin Total: 1 mg/dL (ref 0.0–1.2)
CO2: 24 mmol/L (ref 20–29)
Calcium: 9.2 mg/dL (ref 8.7–10.2)
Chloride: 102 mmol/L (ref 96–106)
Creatinine, Ser: 0.75 mg/dL — ABNORMAL LOW (ref 0.76–1.27)
GFR calc Af Amer: 122 mL/min/{1.73_m2} (ref >59)
GFR calc non Af Amer: 105 mL/min/{1.73_m2} (ref >59)
Globulin, Total: 1.7 g/dL (ref 1.5–4.5)
Glucose: 86 mg/dL (ref 65–99)
Potassium: 4.6 mmol/L (ref 3.5–5.2)
Sodium: 140 mmol/L (ref 134–144)
Total Protein: 6.3 g/dL (ref 6.0–8.5)

## 2020-07-19 LAB — PSA: Prostate Specific Ag, Serum: 2.3 ng/mL (ref 0.0–4.0)

## 2020-07-19 NOTE — Progress Notes (Unsigned)
Placed [place] of abd and pelvic

## 2020-07-19 NOTE — Progress Notes (Unsigned)
Placed ref to uro 

## 2020-07-23 ENCOUNTER — Encounter: Payer: Self-pay | Admitting: Urology

## 2020-07-23 ENCOUNTER — Other Ambulatory Visit: Payer: Self-pay

## 2020-07-23 ENCOUNTER — Ambulatory Visit (INDEPENDENT_AMBULATORY_CARE_PROVIDER_SITE_OTHER): Payer: BC Managed Care – PPO | Admitting: Urology

## 2020-07-23 VITALS — BP 118/71 | HR 76 | Ht 70.0 in | Wt 141.0 lb

## 2020-07-23 DIAGNOSIS — Z125 Encounter for screening for malignant neoplasm of prostate: Secondary | ICD-10-CM | POA: Diagnosis not present

## 2020-07-23 DIAGNOSIS — N529 Male erectile dysfunction, unspecified: Secondary | ICD-10-CM

## 2020-07-23 NOTE — Patient Instructions (Signed)
Prostate Cancer Screening  Prostate cancer screening is a test that is done to check for the presence of prostate cancer in men. The prostate gland is a walnut-sized gland that is located below the bladder and in front of the rectum in males. The function of the prostate is to add fluid to semen during ejaculation. Prostate cancer is the second most common type of cancer in men. Who should have prostate cancer screening?  Screening recommendations vary based on age and other risk factors. Screening is recommended if:  You are older than age 55. If you are age 55-69, talk with your health care provider about your need for screening and how often screening should be done. Because most prostate cancers are slow growing and will not cause death, screening is generally reserved in this age group for men who have a 10-15-year life expectancy.  You are younger than age 55, and you have these risk factors: ? Being a black male or a male of African descent. ? Having a father, brother, or uncle who has been diagnosed with prostate cancer. The risk is higher if your family member's cancer occurred at an early age. Screening is not recommended if:  You are younger than age 40.  You are between the ages of 40 and 54 and you have no risk factors.  You are 70 years of age or older. At this age, the risks that screening can cause are greater than the benefits that it may provide. If you are at high risk for prostate cancer, your health care provider may recommend that you have screenings more often or that you start screening at a younger age. How is screening for prostate cancer done? The recommended prostate cancer screening test is a blood test called the prostate-specific antigen (PSA) test. PSA is a protein that is made in the prostate. As you age, your prostate naturally produces more PSA. Abnormally high PSA levels may be caused by:  Prostate cancer.  An enlarged prostate that is not caused by cancer  (benign prostatic hyperplasia, BPH). This condition is very common in older men.  A prostate gland infection (prostatitis). Depending on the PSA results, you may need more tests, such as:  A physical exam to check the size of your prostate gland.  Blood and imaging tests.  A procedure to remove tissue samples from your prostate gland for testing (biopsy). What are the benefits of prostate cancer screening?  Screening can help to identify cancer at an early stage, before symptoms start and when the cancer can be treated more easily.  There is a small chance that screening may lower your risk of dying from prostate cancer. The chance is small because prostate cancer is a slow-growing cancer, and most men with prostate cancer die from a different cause. What are the risks of prostate cancer screening? The main risk of prostate cancer screening is diagnosing and treating prostate cancer that would never have caused any symptoms or problems. This is called overdiagnosisand overtreatment. PSA screening cannot tell you if your PSA is high due to cancer or a different cause. A prostate biopsy is the only procedure to diagnose prostate cancer. Even the results of a biopsy may not tell you if your cancer needs to be treated. Slow-growing prostate cancer may not need any treatment other than monitoring, so diagnosing and treating it may cause unnecessary stress or other side effects. A prostate biopsy may also cause:  Infection or fever.  A false negative. This is   a result that shows that you do not have prostate cancer when you actually do have prostate cancer. Questions to ask your health care provider  When should I start prostate cancer screening?  What is my risk for prostate cancer?  How often do I need screening?  What type of screening tests do I need?  How do I get my test results?  What do my results mean?  Do I need treatment? Where to find more information  The American Cancer  Society: www.cancer.org  American Urological Association: www.auanet.org Contact a health care provider if:  You have difficulty urinating.  You have pain when you urinate or ejaculate.  You have blood in your urine or semen.  You have pain in your back or in the area of your prostate. Summary  Prostate cancer is a common type of cancer in men. The prostate gland is located below the bladder and in front of the rectum. This gland adds fluid to semen during ejaculation.  Prostate cancer screening may identify cancer at an early stage, when the cancer can be treated more easily.  The prostate-specific antigen (PSA) test is the recommended screening test for prostate cancer.  Discuss the risks and benefits of prostate cancer screening with your health care provider. If you are age 70 or older, the risks that screening can cause are greater than the benefits that it may provide. This information is not intended to replace advice given to you by your health care provider. Make sure you discuss any questions you have with your health care provider. Document Revised: 09/01/2019 Document Reviewed: 12/22/2018 Elsevier Patient Education  2021 Elsevier Inc.  

## 2020-07-23 NOTE — Progress Notes (Signed)
   07/23/2020 10:29 AM   Patrick Fuller May 24, 1966 [id number]  Reason for visit: PSA screening, ED  HPI: [name] was sent back over for an increase in his PSA value.  He is a healthy 55 year old male that underwent a vasectomy with me in January 2020.  He does have a family history of nonlethal prostate cancer in his father's that was treated with radiation in his late 38s.  He was seen by his PCP for some left lower abdominal pain thought to be diverticulitis and treated with antibiotics, and a PSA around that time had increased to 2.3 from a baseline of 0.8.  He denies any change in urinary symptoms.  Urinalysis was benign with PCP recently.  He has an upcoming abdominal ultrasound for further evaluation of mild LFT elevation and lower abdominal pain.  DRE today 30 g, smooth, benign prostate with no nodules or masses  We reviewed the implications of an elevated PSA and the uncertainty surrounding it. In general, a man's PSA increases with age and is produced by both normal and cancerous prostate tissue. The differential diagnosis for elevated PSA includes BPH, prostate cancer, infection, recent intercourse/ejaculation, recent urethroscopic manipulation (foley placement/cystoscopy) or trauma, and prostatitis.   Management of an elevated PSA can include observation or prostate biopsy and we discussed this in detail. Our goal is to detect clinically significant prostate cancers, and manage with either active surveillance, surgery, or radiation for localized disease. Risks of prostate biopsy include bleeding, infection (including life threatening sepsis), pain, and lower urinary symptoms. Hematuria, hematospermia, and blood in the stool are all common after biopsy and can persist up to 4 weeks.   He also has ED that is responsive to low-dose sildenafil on demand that is working well.  He previously tried Cialis in the past and had leg pain with this and would like to continue with the  sildenafil.  We discussed at length that the PSA can fluctuate based on multiple factors, I recommended a repeat PSA in 1 month, and will call with those results.  PSA remains within the normal range for his age.  Sondra Come, MD  [facility] [address] [place], [place] [address] [phone]

## 2020-07-23 NOTE — Addendum Note (Signed)
Addended by: Sueanne Margarita on: 07/23/2020 10:34 AM   Modules accepted: Orders

## 2020-07-24 ENCOUNTER — Ambulatory Visit
Admission: RE | Admit: 2020-07-24 | Discharge: 2020-07-24 | Disposition: A | Discharge status: Home / Self Care | Payer: BC Managed Care – PPO | Source: Ambulatory Visit | Attending: Family Medicine | Admitting: Family Medicine

## 2020-07-24 ENCOUNTER — Other Ambulatory Visit: Payer: Self-pay

## 2020-07-24 DIAGNOSIS — R1032 Left lower quadrant pain: Secondary | ICD-10-CM | POA: Diagnosis present

## 2020-07-24 DIAGNOSIS — R7989 Other specified abnormal findings of blood chemistry: Secondary | ICD-10-CM | POA: Diagnosis present

## 2020-07-24 DIAGNOSIS — R972 Elevated prostate specific antigen [PSA]: Secondary | ICD-10-CM

## 2020-08-01 ENCOUNTER — Encounter: Payer: Self-pay | Admitting: Family Medicine

## 2020-08-02 ENCOUNTER — Encounter: Payer: Self-pay | Admitting: Family Medicine

## 2020-08-02 ENCOUNTER — Other Ambulatory Visit: Payer: Self-pay

## 2020-08-02 ENCOUNTER — Telehealth: Payer: Self-pay

## 2020-08-02 DIAGNOSIS — K769 Liver disease, unspecified: Secondary | ICD-10-CM

## 2020-08-02 DIAGNOSIS — R935 Abnormal findings on diagnostic imaging of other abdominal regions, including retroperitoneum: Secondary | ICD-10-CM

## 2020-08-02 NOTE — Telephone Encounter (Signed)
After speaking to Dr Yetta Barre called and spoke with patient and informed Dr Yetta Barre wanted to go ahead and order MRI for liver lesion that was found on Abdominal US.   Ordered this STAT per Dr Yetta Barre. Called and informed patient and told him we will follow up once we receive the results of this to decide what we will do next.  He verbalized understanding of this.

## 2020-08-05 ENCOUNTER — Ambulatory Visit: Payer: Self-pay | Admitting: Family Medicine

## 2020-08-08 ENCOUNTER — Encounter: Payer: Self-pay | Admitting: Family Medicine

## 2020-08-09 ENCOUNTER — Telehealth: Payer: Self-pay

## 2020-08-09 NOTE — Telephone Encounter (Signed)
Pt was called back and Dr Yetta Barre went over the MRI with him over the phone. She also went over the Liver enzyme report and the colonoscopy report from March of last year/ [facility]. Pt was advised, if he still felt he needed further evaluation, to get in touch with his GI doctor

## 2020-08-26 ENCOUNTER — Encounter: Payer: Self-pay | Admitting: Family Medicine

## 2020-08-28 ENCOUNTER — Other Ambulatory Visit: Payer: Self-pay

## 2020-08-28 ENCOUNTER — Other Ambulatory Visit
Admission: RE | Admit: 2020-08-28 | Discharge: 2020-08-28 | Disposition: A | Discharge status: Home / Self Care | Payer: BC Managed Care – PPO | Attending: Urology | Admitting: Urology

## 2020-08-28 DIAGNOSIS — Z125 Encounter for screening for malignant neoplasm of prostate: Secondary | ICD-10-CM | POA: Diagnosis not present

## 2020-08-29 LAB — PSA (REFLEX TO FREE) (SERIAL): Prostate Specific Ag, Serum: 1.2 ng/mL (ref 0.0–4.0)

## 2020-08-30 ENCOUNTER — Telehealth: Payer: Self-pay

## 2020-08-30 DIAGNOSIS — N529 Male erectile dysfunction, unspecified: Secondary | ICD-10-CM

## 2020-08-30 DIAGNOSIS — Z125 Encounter for screening for malignant neoplasm of prostate: Secondary | ICD-10-CM

## 2020-08-30 NOTE — Telephone Encounter (Signed)
-----   Message from Sondra Come, MD sent at 08/30/2020  7:21 AM EDT ----- Good news, PSA back down to around his baseline. Rtc 1 year with PSA prior  Legrand Rams, MD 08/30/2020

## 2020-08-30 NOTE — Telephone Encounter (Signed)
See mychart message. PSA ordered.  

## 2021-01-20 ENCOUNTER — Encounter: Payer: Self-pay | Admitting: Family Medicine

## 2021-07-21 ENCOUNTER — Encounter: Payer: BC Managed Care – PPO | Admitting: Family Medicine

## 2021-07-28 ENCOUNTER — Other Ambulatory Visit: Payer: Self-pay

## 2021-07-28 ENCOUNTER — Ambulatory Visit (INDEPENDENT_AMBULATORY_CARE_PROVIDER_SITE_OTHER): Payer: BC Managed Care – PPO | Admitting: Family Medicine

## 2021-07-28 ENCOUNTER — Encounter: Payer: Self-pay | Admitting: Family Medicine

## 2021-07-28 VITALS — BP 120/70 | HR 100 | Ht 70.0 in | Wt 150.0 lb

## 2021-07-28 DIAGNOSIS — Z23 Encounter for immunization: Secondary | ICD-10-CM | POA: Diagnosis not present

## 2021-07-28 DIAGNOSIS — E7800 Pure hypercholesterolemia, unspecified: Secondary | ICD-10-CM | POA: Diagnosis not present

## 2021-07-28 DIAGNOSIS — R7989 Other specified abnormal findings of blood chemistry: Secondary | ICD-10-CM | POA: Diagnosis not present

## 2021-07-28 DIAGNOSIS — R972 Elevated prostate specific antigen [PSA]: Secondary | ICD-10-CM

## 2021-07-28 DIAGNOSIS — Z0000 Encounter for general adult medical examination without abnormal findings: Secondary | ICD-10-CM | POA: Diagnosis not present

## 2021-07-28 LAB — HEMOCCULT GUIAC POC 1CARD (OFFICE): Fecal Occult Blood, POC: NEGATIVE

## 2021-07-28 NOTE — Progress Notes (Signed)
Date:  07/28/2021   Name:  Patrick Fuller   DOB:  12/14/1965   MRN:  [id number]   Chief Complaint: Annual Exam and shingles vacc (#1)  Patient is a 56 year old male who presents for a comprehensive physical exam. The patient reports the following problems: none. Health maintenance has been reviewed up to date.     Lab Results  Component Value Date   NA 140 07/18/2020   K 4.6 07/18/2020   CO2 24 07/18/2020   GLUCOSE 86 07/18/2020   BUN 13 07/18/2020   CREATININE 0.75 (L) 07/18/2020   CALCIUM 9.2 07/18/2020   GFRNONAA 105 07/18/2020   Lab Results  Component Value Date   CHOL 165 07/18/2020   HDL 50 07/18/2020   LDLCALC 102 (H) 07/18/2020   TRIG 66 07/18/2020   CHOLHDL 4.2 03/14/2018   Lab Results  Component Value Date   TSH 1.107 10/23/2016   No results found for: HGBA1C Lab Results  Component Value Date   WBC 4.9 07/08/2020   HGB 15.2 07/08/2020   HCT 45.3 07/08/2020   MCV 89 07/08/2020   PLT 273 07/08/2020   Lab Results  Component Value Date   ALT 54 (H) 07/18/2020   AST 44 (H) 07/18/2020   ALKPHOS 35 (L) 07/18/2020   BILITOT 1.0 07/18/2020   No results found for: 25OHVITD2, 25OHVITD3, VD25OH   Review of Systems  Constitutional:  Negative for chills and fever.  HENT:  Negative for drooling, ear discharge, ear pain and sore throat.   Respiratory:  Negative for cough, shortness of breath and wheezing.   Cardiovascular:  Negative for chest pain, palpitations and leg swelling.  Gastrointestinal:  Negative for abdominal pain, blood in stool, constipation, diarrhea and nausea.  Endocrine: Negative for polydipsia.  Genitourinary:  Negative for dysuria, frequency, hematuria and urgency.  Musculoskeletal:  Negative for back pain, myalgias and neck pain.  Skin:  Negative for rash.  Allergic/Immunologic: Negative for environmental allergies.  Neurological:  Negative for dizziness and headaches.  Hematological:  Does not bruise/bleed easily.   Psychiatric/Behavioral:  Negative for suicidal ideas. The patient is not nervous/anxious.    Patient Active Problem List   Diagnosis Date Noted   Vasectomy evaluation 04/04/2018    Allergies  Allergen Reactions   Amoxicillin Hives   Erythromycin Nausea And Vomiting    Other reaction(s): Other (See Comments) Other Reaction: GI UPSET    Past Surgical History:  Procedure Laterality Date   NO PAST SURGERIES      Social History   Tobacco Use   Smoking status: Former   Smokeless tobacco: Never   Tobacco comments:    social in college  Substance Use Topics   Alcohol use: Yes    Alcohol/week: 0.0 standard drinks    Comment: occasionally   Drug use: No     Medication list has been reviewed and updated.  Current Meds  Medication Sig   Multiple Vitamins-Minerals (MULTIVITAMIN MEN 50+ PO) Take 1 tablet by mouth daily.   Riboflavin (VITAMIN B-2 PO) Take 1 tablet by mouth daily.   thiamine (VITAMIN B-1) 100 MG tablet Take 100 mg by mouth daily.   [DISCONTINUED] sildenafil (VIAGRA) 100 MG tablet Take 1 tablet (100 mg total) by mouth daily as needed for erectile dysfunction.    PHQ 2/9 Scores 07/28/2021 07/18/2020 07/08/2020 03/23/2019  PHQ - 2 Score 0 0 0 0  PHQ- 9 Score 0 0 0 0    GAD 7 : Generalized Anxiety  Score 07/28/2021 07/18/2020 07/08/2020  Nervous, Anxious, on Edge 0 0 1  Control/stop worrying 0 0 1  Worry too much - different things 0 0 0  Trouble relaxing 0 0 0  Restless 0 0 0  Easily annoyed or irritable 0 0 0  Afraid - awful might happen 0 0 0  Total GAD 7 Score 0 0 2  Anxiety Difficulty Not difficult at all - Not difficult at all    BP Readings from Last 3 Encounters:  07/28/21 120/70  07/23/20 118/71  07/18/20 104/60    Physical Exam Vitals and nursing note reviewed.  Constitutional:      Appearance: Normal appearance. He is well-groomed and normal weight.  HENT:     Head: Normocephalic.     Jaw: There is normal jaw occlusion.     Right Ear:  Hearing, tympanic membrane, ear canal and external ear normal.     Left Ear: Hearing, tympanic membrane, ear canal and external ear normal.     Nose: Nose normal. No congestion or rhinorrhea.     Mouth/Throat:     Lips: Pink.     Mouth: Mucous membranes are moist. No oral lesions.     Dentition: Normal dentition.     Tongue: No lesions.     Palate: No mass.     Pharynx: Oropharynx is clear. Uvula midline. No pharyngeal swelling, oropharyngeal exudate, posterior oropharyngeal erythema or uvula swelling.     Tonsils: No tonsillar exudate or tonsillar abscesses.  Eyes:     General: Lids are normal. Vision grossly intact. Gaze aligned appropriately. No scleral icterus.       Right eye: No discharge.        Left eye: No discharge.     Extraocular Movements: Extraocular movements intact.     Conjunctiva/sclera: Conjunctivae normal.     Pupils: Pupils are equal, round, and reactive to light.     Funduscopic exam:    Right eye: Red reflex present.        Left eye: Red reflex present. Neck:     Thyroid: No thyroid mass, thyromegaly or thyroid tenderness.     Vascular: Normal carotid pulses. No carotid bruit, hepatojugular reflux or JVD.     Trachea: Trachea and phonation normal. No tracheal deviation.  Cardiovascular:     Rate and Rhythm: Normal rate and regular rhythm.     Pulses: Normal pulses.     Heart sounds: Normal heart sounds, S1 normal and S2 normal. No murmur heard. No systolic murmur is present.  No diastolic murmur is present.    No friction rub. No gallop. No S3 or S4 sounds.  Pulmonary:     Effort: Pulmonary effort is normal. No respiratory distress.     Breath sounds: Normal breath sounds. No decreased breath sounds, wheezing, rhonchi or rales.  Chest:  Breasts:    Breasts are symmetrical.     Right: Normal.     Left: Normal.  Abdominal:     General: Bowel sounds are normal.     Palpations: Abdomen is soft. There is no hepatomegaly, splenomegaly or mass.      Tenderness: There is no abdominal tenderness. There is no guarding or rebound.  Genitourinary:    Pubic Area: No rash.      Penis: Normal and circumcised.      Testes: Normal.        Right: Mass not present.        Left: Mass not present.  Epididymis:     Right: Normal.     Left: Normal.     Prostate: Normal. Not enlarged, not tender and no nodules present.     Rectum: Normal. Guaiac result negative. No mass.  Musculoskeletal:        General: No tenderness. Normal range of motion.     Cervical back: Normal, full passive range of motion without pain, normal range of motion and neck supple.     Thoracic back: Normal.     Lumbar back: Normal.     Right lower leg: No edema.     Left lower leg: No edema.  Lymphadenopathy:     Head:     Right side of head: No submental or submandibular adenopathy.     Left side of head: No submental or submandibular adenopathy.     Cervical: No cervical adenopathy.     Right cervical: No superficial, deep or posterior cervical adenopathy.    Left cervical: No superficial, deep or posterior cervical adenopathy.     Upper Body:     Right upper body: No supraclavicular or axillary adenopathy.     Left upper body: No supraclavicular or axillary adenopathy.  Skin:    General: Skin is warm.     Findings: No lesion or rash.  Neurological:     Mental Status: He is alert and oriented to person, place, and time.     Cranial Nerves: Cranial nerves 2-12 are intact. No cranial nerve deficit.     Sensory: Sensation is intact.     Motor: Motor function is intact.     Coordination: Romberg sign negative.     Deep Tendon Reflexes: Reflexes are normal and symmetric.     Reflex Scores:      Tricep reflexes are 2+ on the right side and 2+ on the left side.      Bicep reflexes are 2+ on the right side and 2+ on the left side.      Brachioradialis reflexes are 2+ on the right side and 2+ on the left side.      Patellar reflexes are 2+ on the right side and 2+ on  the left side.      Achilles reflexes are 2+ on the right side and 2+ on the left side. Psychiatric:        Attention and Perception: Attention and perception normal.        Mood and Affect: Affect normal.        Speech: Speech normal.        Behavior: Behavior is cooperative.    Wt Readings from Last 3 Encounters:  07/28/21 150 lb (68 kg)  07/23/20 141 lb (64 kg)  07/18/20 141 lb (64 kg)    BP 120/70    Pulse 100    Ht 5\' 10"  (1.778 m)    Wt 150 lb (68 kg)    BMI 21.52 kg/m   Assessment and Plan: Patient's chart was reviewed for previous encounters most recent labs most recent imaging evals concerning urology and hep otology.Patrick Fuller is a 56 y.o. male who presents today for his Complete Annual Exam. He feels well. He reports exercising . He reports he is sleeping well.    1. Annual physical exam Immunizations are reviewed and recommendations provided.   Age appropriate screening tests are discussed. Counseling given for risk factor reduction interventions.  No subjective/objective concerns noted during HPI/past medical history/review of medications/review of system and physical exam.  We have obtained  labs including renal function panel CBC. - POCT Occult Blood Stool - Renal Function Panel - CBC with Differential/Platelet  2. Elevated PSA Patient is followed by Dr. Inda Castle and has an upcoming exam in the next few weeks at which time we will hold on obtaining PSA and let him obtain his level whether he does total and/or fractionation.  3. Pure hypercholesterol Patient with history of elevated cholesterol we will check lipid panel.- Lipid Panel With LDL/HDL Ratio  4. Elevated LFTs Patient is followed by liver specialist for suspected [place] disease and we will obtain LFTs to see if she has been further changes. - Hepatic Function Panel (6)

## 2021-07-28 NOTE — Patient Instructions (Signed)

## 2021-07-29 ENCOUNTER — Ambulatory Visit: Payer: Self-pay | Admitting: Urology

## 2021-07-29 LAB — RENAL FUNCTION PANEL
Albumin: 4.7 g/dL (ref 3.8–4.9)
BUN/Creatinine Ratio: 13 (ref 9–20)
BUN: 10 mg/dL (ref 6–24)
CO2: 25 mmol/L (ref 20–29)
Calcium: 9.4 mg/dL (ref 8.7–10.2)
Chloride: 107 mmol/L — ABNORMAL HIGH (ref 96–106)
Creatinine, Ser: 0.78 mg/dL (ref 0.76–1.27)
Glucose: 97 mg/dL (ref 70–99)
Phosphorus: 3 mg/dL (ref 2.8–4.1)
Potassium: 4.4 mmol/L (ref 3.5–5.2)
Sodium: 145 mmol/L — ABNORMAL HIGH (ref 134–144)
eGFR: 107 mL/min/{1.73_m2} (ref >59)

## 2021-07-29 LAB — CBC WITH DIFFERENTIAL/PLATELET
Basophils Absolute: 0 10*3/uL (ref 0.0–0.2)
Basos: 1 %
EOS (ABSOLUTE): 0.1 10*3/uL (ref 0.0–0.4)
Eos: 2 %
Hematocrit: 47 % (ref 37.5–51.0)
Hemoglobin: 15.4 g/dL (ref 13.0–17.7)
Immature Grans (Abs): 0 10*3/uL (ref 0.0–0.1)
Immature Granulocytes: 0 %
Lymphocytes Absolute: 1.2 10*3/uL (ref 0.7–3.1)
Lymphs: 27 %
MCH: 28.2 pg (ref 26.6–33.0)
MCHC: 32.8 g/dL (ref 31.5–35.7)
MCV: 86 fL (ref 79–97)
Monocytes Absolute: 0.3 10*3/uL (ref 0.1–0.9)
Monocytes: 6 %
Neutrophils Absolute: 2.8 10*3/uL (ref 1.4–7.0)
Neutrophils: 64 %
Platelets: 292 10*3/uL (ref 150–450)
RBC: 5.46 x10E6/uL (ref 4.14–5.80)
RDW: 13 % (ref 11.6–15.4)
WBC: 4.3 10*3/uL (ref 3.4–10.8)

## 2021-07-29 LAB — LIPID PANEL WITH LDL/HDL RATIO
Cholesterol, Total: 199 mg/dL (ref 100–199)
HDL: 42 mg/dL (ref >39)
LDL Chol Calc (NIH): 138 mg/dL — ABNORMAL HIGH (ref 0–99)
LDL/HDL Ratio: 3.3 ratio (ref 0.0–3.6)
Triglycerides: 102 mg/dL (ref 0–149)
VLDL Cholesterol Cal: 19 mg/dL (ref 5–40)

## 2021-07-29 LAB — HEPATIC FUNCTION PANEL (6)
ALT: 26 IU/L (ref 0–44)
AST: 25 IU/L (ref 0–40)
Alkaline Phosphatase: 47 IU/L (ref 44–121)
Bilirubin Total: 1.2 mg/dL (ref 0.0–1.2)
Bilirubin, Direct: 0.21 mg/dL (ref 0.00–0.40)

## 2021-08-12 ENCOUNTER — Ambulatory Visit: Payer: BC Managed Care – PPO | Admitting: Urology

## 2021-08-26 ENCOUNTER — Encounter: Payer: Self-pay | Admitting: Urology

## 2021-08-26 ENCOUNTER — Ambulatory Visit: Payer: BC Managed Care – PPO | Admitting: Urology

## 2021-08-26 ENCOUNTER — Other Ambulatory Visit
Admission: RE | Admit: 2021-08-26 | Discharge: 2021-08-26 | Disposition: A | Discharge status: Home / Self Care | Payer: BC Managed Care – PPO | Attending: Urology | Admitting: Urology

## 2021-08-26 ENCOUNTER — Other Ambulatory Visit: Payer: Self-pay | Admitting: *Deleted

## 2021-08-26 VITALS — BP 107/69 | HR 75 | Ht 70.0 in | Wt 149.0 lb

## 2021-08-26 DIAGNOSIS — Z125 Encounter for screening for malignant neoplasm of prostate: Secondary | ICD-10-CM | POA: Diagnosis not present

## 2021-08-26 LAB — PSA: Prostatic Specific Antigen: 0.75 ng/mL (ref 0.00–4.00)

## 2021-08-26 NOTE — Progress Notes (Signed)
? ?  08/26/2021 ?12:46 PM  ? ?Patrick Fuller ?12-25-1965 ?[id number] ? ?Reason for visit: Follow up PSA screening, ED ? ?HPI: ?Healthy 56 year old male with family history of nonlethal prostate cancer in his father who I saw last year for slight increase in PSA at 2.3 from a baseline of 0.8.  DRE was benign and repeat PSA dropped back down to 1.2 reassuringly.  He also has had some ED in the past responsive to sildenafil, however he feels he has not needed this medication over the last year. ? ?PSA today is pending.  We reviewed the AUA guidelines regarding screening, and if PSA returns normal can continue screening every 1 to 2 years with PCP.  Risk and benefits reviewed. ? ?Call with PSA results, if normal can resume screening every 1 to 2 years with PCP ? ?Sondra Come, MD ? ?[facility] ?[address] ?[place], [place] [address] ?[phone] ? ? ?

## 2021-08-26 NOTE — Patient Instructions (Signed)
Prostate Cancer Screening ?Prostate cancer screening is testing that is done to check for the presence of prostate cancer in men. The prostate gland is a walnut-sized gland that is located below the bladder and in front of the rectum in males. The function of the prostate is to add fluid to semen during ejaculation. Prostate cancer is one of the most common types of cancer in men. ?Who should have prostate cancer screening? ?Screening recommendations vary based on age and other risk factors, as well as between the professional organizations who make the recommendations. ?In general, screening is recommended if: ?You are age 50 to 70 and have an average risk for prostate cancer. You should talk with your health care provider about your need for screening and how often screening should be done. Because most prostate cancers are slow growing and will not cause death, screening in this age group is generally reserved for men who have a 10- to 15-year life expectancy. ?You are younger than age 50, and you have these risk factors: ?Having a father, brother, or uncle who has been diagnosed with prostate cancer. The risk is higher if your family member's cancer occurred at an early age or if you have multiple family members with prostate cancer at an early age. ?Being a male who is Black or is of Caribbean or sub-Saharan African descent. ?In general, screening is not recommended if: ?You are younger than age 40. ?You are between the ages of 40 and 49 and you have no risk factors. ?You are 70 years of age or older. At this age, the risks that screening can cause are greater than the benefits that it may provide. ?If you are at high risk for prostate cancer, your health care provider may recommend that you have screenings more often or that you start screening at a younger age. ?How is screening for prostate cancer done? ?The recommended prostate cancer screening test is a blood test called the prostate-specific antigen (PSA)  test. PSA is a protein that is made in the prostate. As you age, your prostate naturally produces more PSA. Abnormally high PSA levels may be caused by: ?Prostate cancer. ?An enlarged prostate that is not caused by cancer (benign prostatic hyperplasia, or BPH). This condition is very common in older men. ?A prostate gland infection (prostatitis) or urinary tract infection. ?Certain medicines such as male hormones (like testosterone) or other medicines that raise testosterone levels. ?A rectal exam may be done as part of prostate cancer screening to help provide information about the size of your prostate gland. When a rectal exam is performed, it should be done after the PSA level is drawn to avoid any effect on the results. ?Depending on the PSA results, you may need more tests, such as: ?A physical exam to check the size of your prostate gland, if not done as part of screening. ?Blood and imaging tests. ?A procedure to remove tissue samples from your prostate gland for testing (biopsy). This is the only way to know for certain if you have prostate cancer. ?What are the benefits of prostate cancer screening? ?Screening can help to identify cancer at an early stage, before symptoms start and when the cancer can be treated more easily. ?There is a small chance that screening may lower your risk of dying from prostate cancer. The chance is small because prostate cancer is a slow-growing cancer, and most men with prostate cancer die from a different cause. ?What are the risks of prostate cancer screening? ?The main   risk of prostate cancer screening is diagnosing and treating prostate cancer that would never have caused any symptoms or problems. This is called overdiagnosisand overtreatment. PSA screening cannot tell you if your PSA is high due to cancer or a different cause. A prostate biopsy is the only procedure to diagnose prostate cancer. Even the results of a biopsy may not tell you if your cancer needs to be  treated. Slow-growing prostate cancer may not need any treatment other than monitoring, so diagnosing and treating it may cause unnecessary stress or other side effects. ?Questions to ask your health care provider ?When should I start prostate cancer screening? ?What is my risk for prostate cancer? ?How often do I need screening? ?What type of screening tests do I need? ?How do I get my test results? ?What do my results mean? ?Do I need treatment? ?Where to find more information ?The American Cancer Society: www.cancer.org ?American Urological Association: www.auanet.org ?Contact a health care provider if: ?You have difficulty urinating. ?You have pain when you urinate or ejaculate. ?You have blood in your urine or semen. ?You have pain in your back or in the area of your prostate. ?Summary ?Prostate cancer is a common type of cancer in men. The prostate gland is located below the bladder and in front of the rectum. This gland adds fluid to semen during ejaculation. ?Prostate cancer screening may identify cancer at an early stage, when the cancer can be treated more easily and is less likely to have spread to other areas of the body. ?The prostate-specific antigen (PSA) test is the recommended screening test for prostate cancer, but it has associated risks. ?Discuss the risks and benefits of prostate cancer screening with your health care provider. If you are age 70 or older, the risks that screening can cause are greater than the benefits that it may provide. ?This information is not intended to replace advice given to you by your health care provider. Make sure you discuss any questions you have with your health care provider. ?Document Revised: 11/04/2020 Document Reviewed: 11/04/2020 ?Elsevier Patient Education ? 2022 [employer] ? ?

## 2021-10-31 ENCOUNTER — Ambulatory Visit: Payer: BC Managed Care – PPO

## 2021-10-31 ENCOUNTER — Ambulatory Visit (INDEPENDENT_AMBULATORY_CARE_PROVIDER_SITE_OTHER): Payer: BC Managed Care – PPO

## 2021-10-31 DIAGNOSIS — Z23 Encounter for immunization: Secondary | ICD-10-CM | POA: Diagnosis not present

## 2022-07-30 ENCOUNTER — Encounter: Payer: BC Managed Care – PPO | Admitting: Family Medicine

## 2022-08-03 ENCOUNTER — Ambulatory Visit (INDEPENDENT_AMBULATORY_CARE_PROVIDER_SITE_OTHER): Payer: BC Managed Care – PPO | Admitting: Family Medicine

## 2022-08-03 ENCOUNTER — Encounter: Payer: Self-pay | Admitting: Family Medicine

## 2022-08-03 VITALS — BP 90/68 | HR 68 | Ht 70.0 in | Wt 149.0 lb

## 2022-08-03 DIAGNOSIS — Z1159 Encounter for screening for other viral diseases: Secondary | ICD-10-CM | POA: Diagnosis not present

## 2022-08-03 DIAGNOSIS — Z0000 Encounter for general adult medical examination without abnormal findings: Secondary | ICD-10-CM | POA: Diagnosis not present

## 2022-08-03 LAB — HEMOCCULT GUIAC POC 1CARD (OFFICE): Fecal Occult Blood, POC: NEGATIVE

## 2022-08-03 NOTE — Progress Notes (Signed)
Date:  08/03/2022   Name:  Patrick Fuller   DOB:  1965/10/18   MRN:  [id number]   Chief Complaint: Annual Exam  Patient is a 57 year old male who presents for a comprehensive physical exam. The patient reports the following problems: none. Health maintenance has been reviewed up to date.      Lab Results  Component Value Date   NA 145 (H) 07/28/2021   K 4.4 07/28/2021   CO2 25 07/28/2021   GLUCOSE 97 07/28/2021   BUN 10 07/28/2021   CREATININE 0.78 07/28/2021   CALCIUM 9.4 07/28/2021   EGFR 107 07/28/2021   GFRNONAA 105 07/18/2020   Lab Results  Component Value Date   CHOL 199 07/28/2021   HDL 42 07/28/2021   LDLCALC 138 (H) 07/28/2021   TRIG 102 07/28/2021   CHOLHDL 4.2 03/14/2018   Lab Results  Component Value Date   TSH 1.107 10/23/2016   No results found for: "HGBA1C" Lab Results  Component Value Date   WBC 4.3 07/28/2021   HGB 15.4 07/28/2021   HCT 47.0 07/28/2021   MCV 86 07/28/2021   PLT 292 07/28/2021   Lab Results  Component Value Date   ALT 26 07/28/2021   AST 25 07/28/2021   ALKPHOS 47 07/28/2021   BILITOT 1.2 07/28/2021   No results found for: "25OHVITD2", "25OHVITD3", "VD25OH"   Review of Systems  Constitutional:  Negative for chills, fatigue, fever and unexpected weight change.  HENT:  Negative for trouble swallowing.   Eyes:  Negative for visual disturbance.  Respiratory:  Negative for chest tightness, shortness of breath and wheezing.   Cardiovascular:  Negative for chest pain, palpitations and leg swelling.  Gastrointestinal:  Negative for abdominal pain, blood in stool, nausea and vomiting.  Endocrine: Negative for polydipsia and polyuria.  Genitourinary:  Negative for difficulty urinating.  Musculoskeletal:  Negative for back pain.  Skin:  Negative for color change.    Patient Active Problem List   Diagnosis Date Noted   Vasectomy evaluation 04/04/2018    Allergies  Allergen Reactions   Amoxicillin Hives    Erythromycin Nausea And Vomiting    Other reaction(s): Other (See Comments) Other Reaction: GI UPSET    Past Surgical History:  Procedure Laterality Date   NO PAST SURGERIES      Social History   Tobacco Use   Smoking status: Former    Passive exposure: Past   Smokeless tobacco: Never   Tobacco comments:    social in college  Substance Use Topics   Alcohol use: Yes    Alcohol/week: 0.0 standard drinks of alcohol    Comment: occasionally   Drug use: No     Medication list has been reviewed and updated.  Current Meds  Medication Sig   Multiple Vitamins-Minerals (MULTIVITAMIN MEN 50+ PO) Take 1 tablet by mouth daily.   Riboflavin (VITAMIN B-2 PO) Take 1 tablet by mouth daily.   thiamine (VITAMIN B-1) 100 MG tablet Take 100 mg by mouth daily.       08/03/2022    9:50 AM 07/28/2021    9:31 AM 07/18/2020    8:50 AM 07/08/2020    9:53 AM  GAD 7 : Generalized Anxiety Score  Nervous, Anxious, on Edge 0 0 0 1  Control/stop worrying 0 0 0 1  Worry too much - different things 0 0 0 0  Trouble relaxing 0 0 0 0  Restless 0 0 0 0  Easily annoyed or irritable  0 0 0 0  Afraid - awful might happen 0 0 0 0  Total GAD 7 Score 0 0 0 2  Anxiety Difficulty Not difficult at all Not difficult at all  Not difficult at all       08/03/2022    9:50 AM 07/28/2021    9:31 AM 07/18/2020    8:49 AM  Depression screen PHQ 2/9  Decreased Interest 0 0 0  Down, Depressed, Hopeless 0 0 0  PHQ - 2 Score 0 0 0  Altered sleeping 0 0 0  Tired, decreased energy 0 0 0  Change in appetite 0 0 0  Feeling bad or failure about yourself  0 0 0  Trouble concentrating 0 0 0  Moving slowly or fidgety/restless 0 0 0  Suicidal thoughts 0 0 0  PHQ-9 Score 0 0 0  Difficult doing work/chores Not difficult at all Not difficult at all     BP Readings from Last 3 Encounters:  08/03/22 90/68  08/26/21 107/69  07/28/21 120/70    Physical Exam Vitals and nursing note reviewed.  Constitutional:       Appearance: He is well-groomed and normal weight.  HENT:     Head: Normocephalic.     Jaw: There is normal jaw occlusion.     Right Ear: Tympanic membrane, ear canal and external ear normal.     Left Ear: Tympanic membrane, ear canal and external ear normal.     Nose: Nose normal.     Mouth/Throat:     Lips: Pink.     Mouth: Mucous membranes are moist.     Dentition: Normal dentition.     Tongue: No lesions.     Palate: No mass.     Pharynx: Oropharynx is clear.  Eyes:     General: Lids are normal. Gaze aligned appropriately. No scleral icterus.       Right eye: No discharge.        Left eye: No discharge.     Conjunctiva/sclera: Conjunctivae normal.     Pupils: Pupils are equal, round, and reactive to light.     Funduscopic exam:    Right eye: Red reflex present.        Left eye: Red reflex present. Neck:     Thyroid: No thyroid mass, thyromegaly or thyroid tenderness.     Vascular: Normal carotid pulses. No carotid bruit, hepatojugular reflux or JVD.     Trachea: Trachea and phonation normal. No tracheal deviation.  Cardiovascular:     Rate and Rhythm: Normal rate and regular rhythm.     Chest Wall: PMI is not displaced.     Pulses: Normal pulses.          Carotid pulses are 2+ on the right side and 2+ on the left side.      Radial pulses are 2+ on the right side and 2+ on the left side.       Femoral pulses are 2+ on the right side and 2+ on the left side.      Popliteal pulses are 2+ on the right side and 2+ on the left side.       Dorsalis pedis pulses are 2+ on the right side and 2+ on the left side.       Posterior tibial pulses are 2+ on the right side and 2+ on the left side.     Heart sounds: Normal heart sounds, S1 normal and S2 normal. No murmur heard.  No systolic murmur is present.     No diastolic murmur is present.     No friction rub. No gallop. No S3 or S4 sounds.  Pulmonary:     Effort: No respiratory distress.     Breath sounds: Normal breath sounds.  No decreased breath sounds, wheezing, rhonchi or rales.  Chest:  Breasts:    Breasts are symmetrical.     Right: Normal.     Left: Normal.  Abdominal:     General: Abdomen is flat. Bowel sounds are normal.     Palpations: Abdomen is soft. There is no hepatomegaly, splenomegaly or mass.     Tenderness: There is no abdominal tenderness. There is no guarding or rebound.     Hernia: There is no hernia in the umbilical area or ventral area.  Genitourinary:    Penis: Normal.      Testes: Normal.     Epididymis:     Right: Normal.     Left: Normal.     Prostate: Normal. Not enlarged, not tender and no nodules present.     Rectum: Guaiac result negative. No mass.  Musculoskeletal:        General: No tenderness. Normal range of motion.     Cervical back: Full passive range of motion without pain, normal range of motion and neck supple.     Right lower leg: No edema.     Left lower leg: No edema.  Lymphadenopathy:     Head:     Right side of head: No submandibular adenopathy.     Left side of head: No submandibular adenopathy.     Cervical: No cervical adenopathy.     Right cervical: No superficial, deep or posterior cervical adenopathy.    Left cervical: No superficial, deep or posterior cervical adenopathy.     Upper Body:     Right upper body: No supraclavicular or axillary adenopathy.     Left upper body: No supraclavicular or axillary adenopathy.  Skin:    General: Skin is warm.     Capillary Refill: Capillary refill takes less than 2 seconds.     Findings: No rash.  Neurological:     Mental Status: He is alert and oriented to person, place, and time.     Cranial Nerves: Cranial nerves 2-12 are intact. No cranial nerve deficit.     Sensory: Sensation is intact.     Motor: Motor function is intact.     Coordination: Romberg sign negative.     Deep Tendon Reflexes: Reflexes are normal and symmetric.  Psychiatric:        Behavior: Behavior is cooperative.     Wt Readings  from Last 3 Encounters:  08/03/22 149 lb (67.6 kg)  08/26/21 149 lb (67.6 kg)  07/28/21 150 lb (68 kg)    BP 90/68   Pulse 68   Ht '5\' 10"'$  (1.778 m)   Wt 149 lb (67.6 kg)   SpO2 95%   BMI 21.38 kg/m   Assessment and Plan: BENNARD ZAFAR is a 57 y.o. male who presents today for his Complete Annual Exam. He feels well. He reports exercising . He reports he is sleeping well. Immunizations are reviewed and recommendations provided.   Age appropriate screening tests are discussed. Counseling given for risk factor reduction interventions.    1. Annual physical exam No subjective no objective concerns noted during review of past medical history medications, history of present illness, review of systems, and physical exam..- PSA -  Lipid Panel With LDL/HDL Ratio - Comprehensive Metabolic Panel (CMET) - POCT Occult Blood Stool  2. Need for hepatitis C screening test Patient has not been screened for hepatitis C and we will proceed with this maintenance. - Hepatitis C antibody

## 2022-08-04 LAB — COMPREHENSIVE METABOLIC PANEL
ALT: 26 IU/L (ref 0–44)
AST: 22 IU/L (ref 0–40)
Albumin/Globulin Ratio: 2.7 — ABNORMAL HIGH (ref 1.2–2.2)
Albumin: 4.8 g/dL (ref 3.8–4.9)
Alkaline Phosphatase: 46 IU/L (ref 44–121)
BUN/Creatinine Ratio: 16 (ref 9–20)
BUN: 12 mg/dL (ref 6–24)
Bilirubin Total: 1.1 mg/dL (ref 0.0–1.2)
CO2: 22 mmol/L (ref 20–29)
Calcium: 9.2 mg/dL (ref 8.7–10.2)
Chloride: 103 mmol/L (ref 96–106)
Creatinine, Ser: 0.75 mg/dL — ABNORMAL LOW (ref 0.76–1.27)
Globulin, Total: 1.8 g/dL (ref 1.5–4.5)
Glucose: 77 mg/dL (ref 70–99)
Potassium: 3.9 mmol/L (ref 3.5–5.2)
Sodium: 142 mmol/L (ref 134–144)
Total Protein: 6.6 g/dL (ref 6.0–8.5)
eGFR: 107 mL/min/{1.73_m2} (ref >59)

## 2022-08-04 LAB — LIPID PANEL WITH LDL/HDL RATIO
Cholesterol, Total: 214 mg/dL — ABNORMAL HIGH (ref 100–199)
HDL: 45 mg/dL (ref >39)
LDL Chol Calc (NIH): 152 mg/dL — ABNORMAL HIGH (ref 0–99)
LDL/HDL Ratio: 3.4 ratio (ref 0.0–3.6)
Triglycerides: 97 mg/dL (ref 0–149)
VLDL Cholesterol Cal: 17 mg/dL (ref 5–40)

## 2022-08-04 LAB — PSA: Prostate Specific Ag, Serum: 2 ng/mL (ref 0.0–4.0)

## 2022-08-04 LAB — HEPATITIS C ANTIBODY: Hep C Virus Ab: NONREACTIVE

## 2022-08-05 ENCOUNTER — Encounter: Payer: Self-pay | Admitting: Family Medicine

## 2022-08-06 ENCOUNTER — Other Ambulatory Visit: Payer: Self-pay

## 2022-08-06 DIAGNOSIS — E782 Mixed hyperlipidemia: Secondary | ICD-10-CM

## 2022-08-06 MED ORDER — ATORVASTATIN CALCIUM 10 MG PO TABS: 10.0000 mg | ORAL_TABLET | Freq: Every day | ORAL | 1 refills | Status: DC

## 2022-08-06 NOTE — Telephone Encounter (Signed)
Please review. Pt wants to start statin.  KP

## 2022-08-28 ENCOUNTER — Other Ambulatory Visit: Payer: Self-pay | Admitting: Family Medicine

## 2022-08-28 DIAGNOSIS — E782 Mixed hyperlipidemia: Secondary | ICD-10-CM

## 2022-08-28 NOTE — Telephone Encounter (Signed)
Requested medications are due for refill today.  no  Requested medications are on the active medications list.  yes  Last refill. 3/14/024 #30 1 rf  Future visit scheduled.   yes  Notes to clinic.  Pharmacy is requesting a 90 day supply.    Requested Prescriptions  Pending Prescriptions Disp Refills   atorvastatin (LIPITOR) 10 MG tablet [Pharmacy Med Name: ATORVASTATIN 10 MG TABLET] 90 tablet 1    Sig: TAKE 1 TABLET BY MOUTH EVERY DAY     Cardiovascular:  Antilipid - Statins Failed - 08/28/2022  8:33 AM      Failed - Lipid Panel in normal range within the last 12 months    Cholesterol, Total  Date Value Ref Range Status  08/03/2022 214 (H) 100 - 199 mg/dL Final   LDL Chol Calc (NIH)  Date Value Ref Range Status  08/03/2022 152 (H) 0 - 99 mg/dL Final   HDL  Date Value Ref Range Status  08/03/2022 45 >39 mg/dL Final   Triglycerides  Date Value Ref Range Status  08/03/2022 97 0 - 149 mg/dL Final         Passed - Patient is not pregnant      Passed - Valid encounter within last 12 months    Recent Outpatient Visits           3 weeks ago Annual physical exam   [place] [facility]   1 year ago Annual physical exam   [place] [facility]   2 years ago Annual physical exam   [facility]   2 years ago Left lower quadrant abdominal pain   [place] [facility]   3 years ago Annual physical exam   [facility]       Future Appointments             In 11 months Duanne Limerick, MD [facility] at [facility], [facility]

## 2022-09-24 ENCOUNTER — Encounter: Payer: Self-pay | Admitting: Family Medicine

## 2022-09-25 ENCOUNTER — Other Ambulatory Visit: Payer: Self-pay | Admitting: Family Medicine

## 2022-09-25 DIAGNOSIS — E782 Mixed hyperlipidemia: Secondary | ICD-10-CM

## 2022-09-25 NOTE — Telephone Encounter (Signed)
Requested Prescriptions  Pending Prescriptions Disp Refills   atorvastatin (LIPITOR) 10 MG tablet [Pharmacy Med Name: ATORVASTATIN 10 MG TABLET] 90 tablet 0    Sig: TAKE 1 TABLET BY MOUTH EVERY DAY     Cardiovascular:  Antilipid - Statins Failed - 09/25/2022  2:25 AM      Failed - Lipid Panel in normal range within the last 12 months    Cholesterol, Total  Date Value Ref Range Status  08/03/2022 214 (H) 100 - 199 mg/dL Final   LDL Chol Calc (NIH)  Date Value Ref Range Status  08/03/2022 152 (H) 0 - 99 mg/dL Final   HDL  Date Value Ref Range Status  08/03/2022 45 >39 mg/dL Final   Triglycerides  Date Value Ref Range Status  08/03/2022 97 0 - 149 mg/dL Final         Passed - Patient is not pregnant      Passed - Valid encounter within last 12 months    Recent Outpatient Visits           1 month ago Annual physical exam   [place] [facility]   1 year ago Annual physical exam   [place] [facility]   2 years ago Annual physical exam   [facility]   2 years ago Left lower quadrant abdominal pain    [facility]   3 years ago Annual physical exam   [facility]       Future Appointments             In 10 months Duanne Limerick, MD [facility] at [facility], [facility]

## 2022-09-28 ENCOUNTER — Other Ambulatory Visit: Payer: Self-pay

## 2022-09-28 DIAGNOSIS — E782 Mixed hyperlipidemia: Secondary | ICD-10-CM

## 2022-09-28 NOTE — Progress Notes (Signed)
Printed labs

## 2022-09-29 ENCOUNTER — Other Ambulatory Visit: Payer: Self-pay

## 2022-09-29 DIAGNOSIS — E782 Mixed hyperlipidemia: Secondary | ICD-10-CM

## 2022-09-29 LAB — LIPID PANEL WITH LDL/HDL RATIO
Cholesterol, Total: 140 mg/dL (ref 100–199)
HDL: 38 mg/dL — ABNORMAL LOW (ref >39)
LDL Chol Calc (NIH): 84 mg/dL (ref 0–99)
LDL/HDL Ratio: 2.2 ratio (ref 0.0–3.6)
Triglycerides: 96 mg/dL (ref 0–149)
VLDL Cholesterol Cal: 18 mg/dL (ref 5–40)

## 2022-09-29 MED ORDER — ATORVASTATIN CALCIUM 10 MG PO TABS: 10.0000 mg | ORAL_TABLET | Freq: Every day | ORAL | 1 refills | Status: DC

## 2023-01-27 IMAGING — US US ABDOMEN COMPLETE
1 series · 13 of 25 positions shown · non-contrast
Comparison: Prior CT from 12/30/2006.

CLINICAL DATA: Initial evaluation for left lower quadrant pain,
elevated PSA, LFTs.

EXAM:
ABDOMEN ULTRASOUND COMPLETE

[Series 1: us abdomen complete · 13 of 115 slices shown]
[im 1/115]
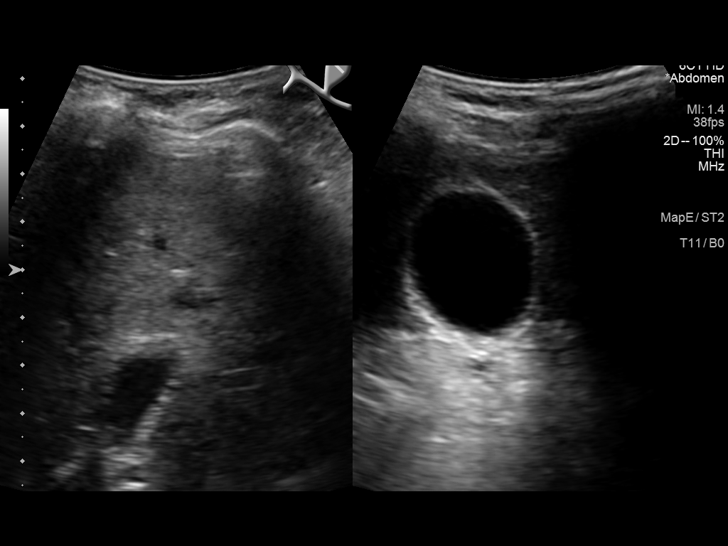
[im 10/115]
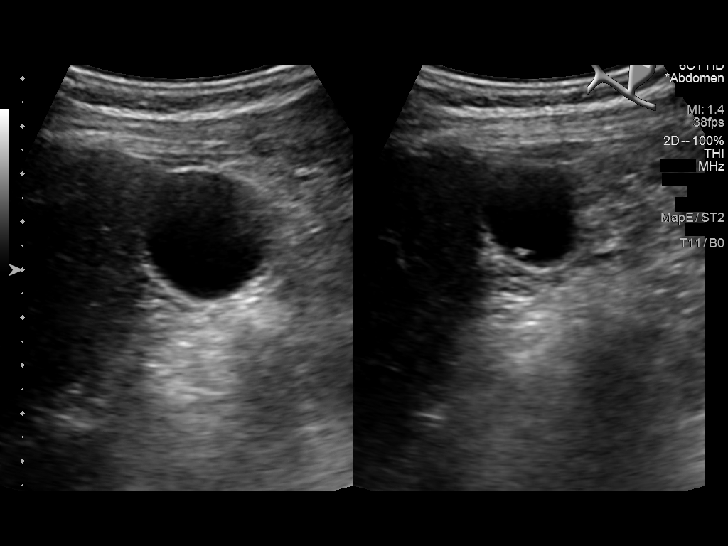
[im 20/115]
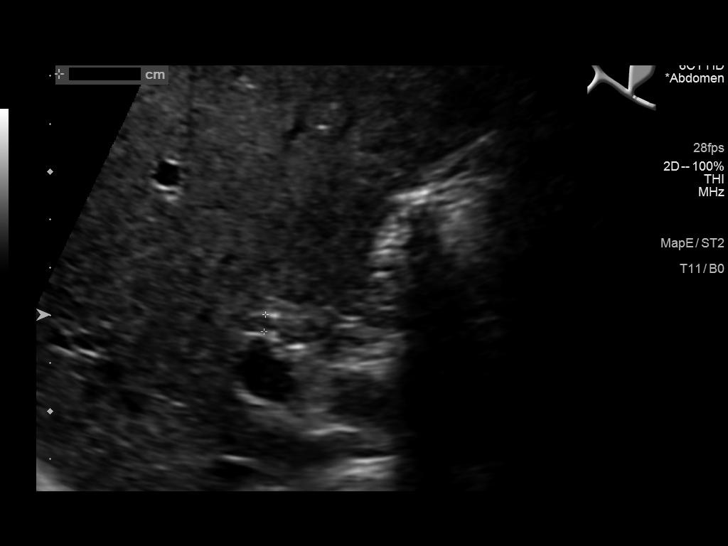
[im 29/115]
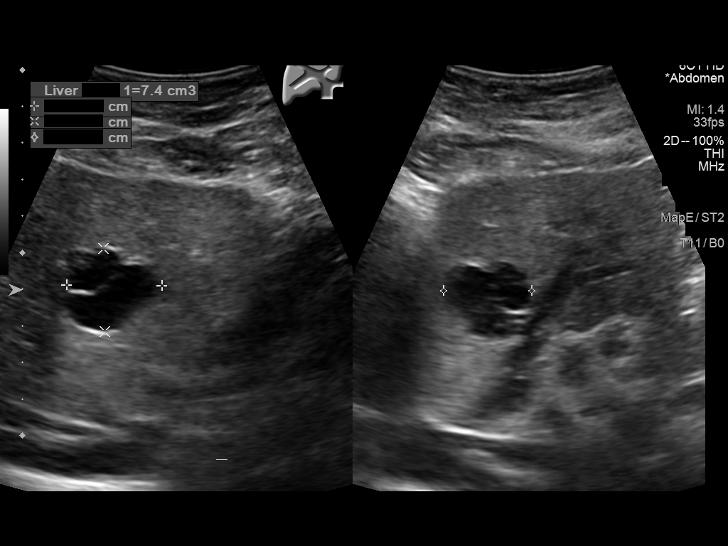
[im 39/115]
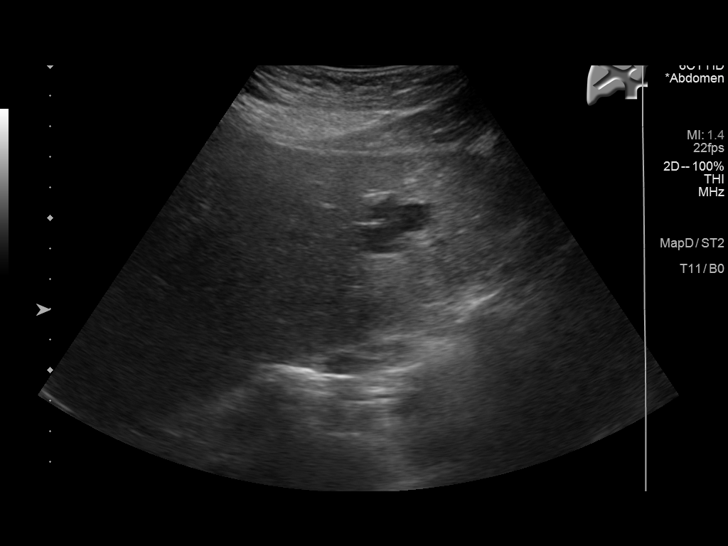
[im 48/115]
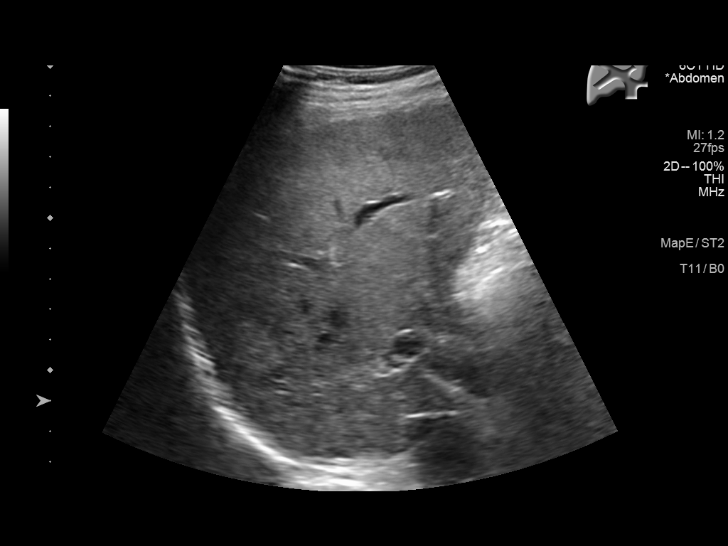
[im 58/115]
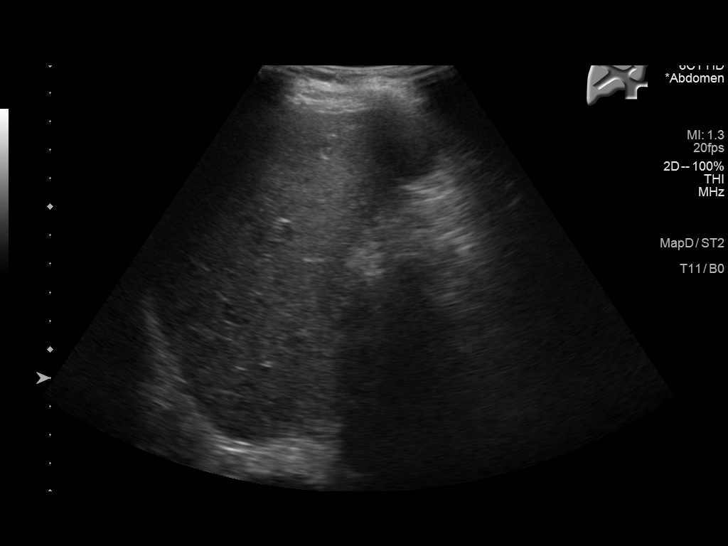
[im 67/115]
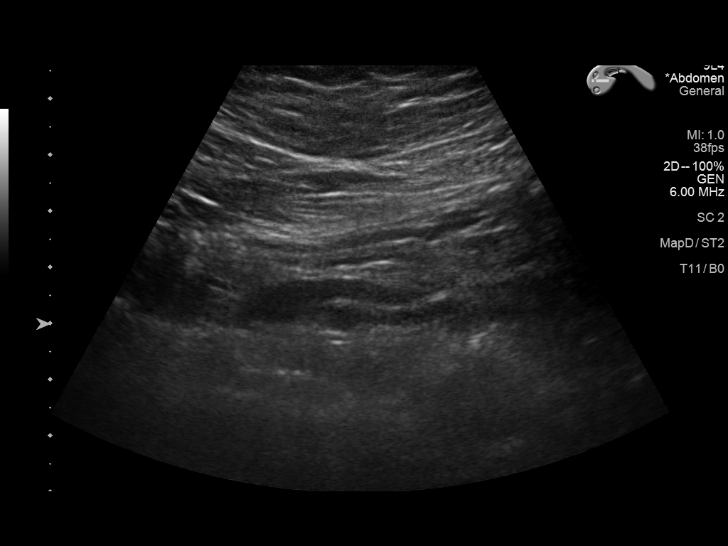
[im 77/115]
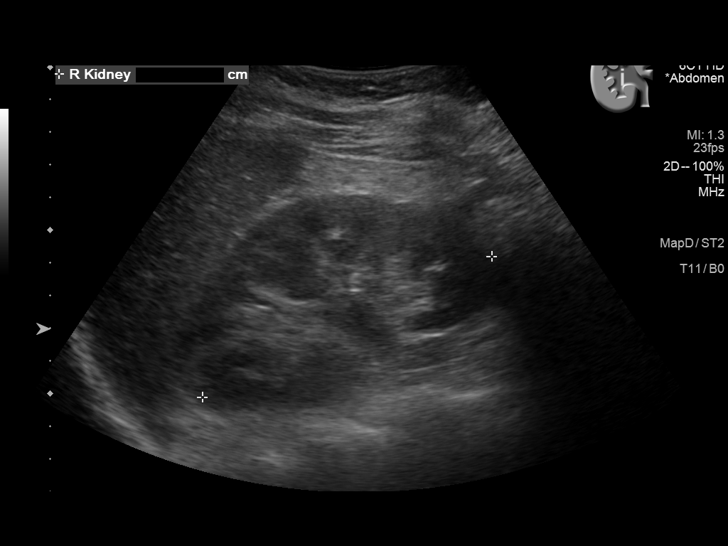
[im 86/115]
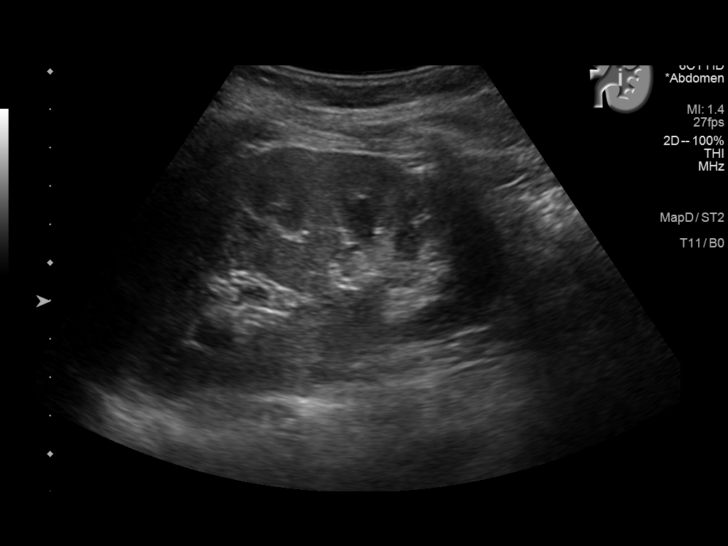
[im 96/115]
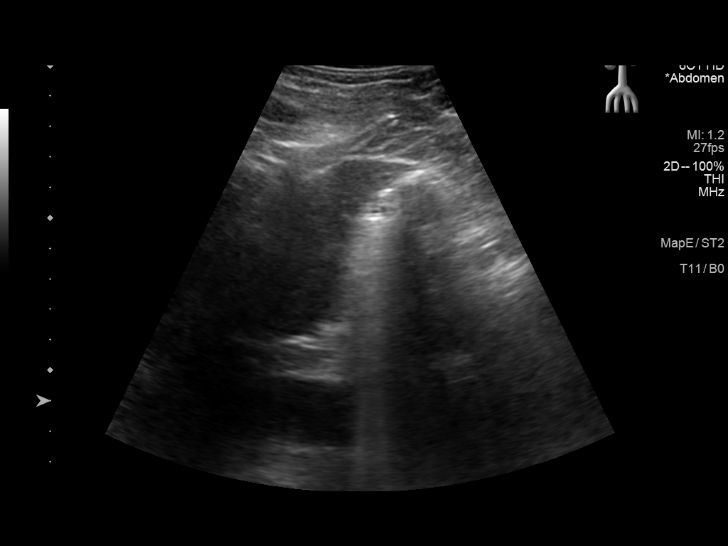
[im 105/115]
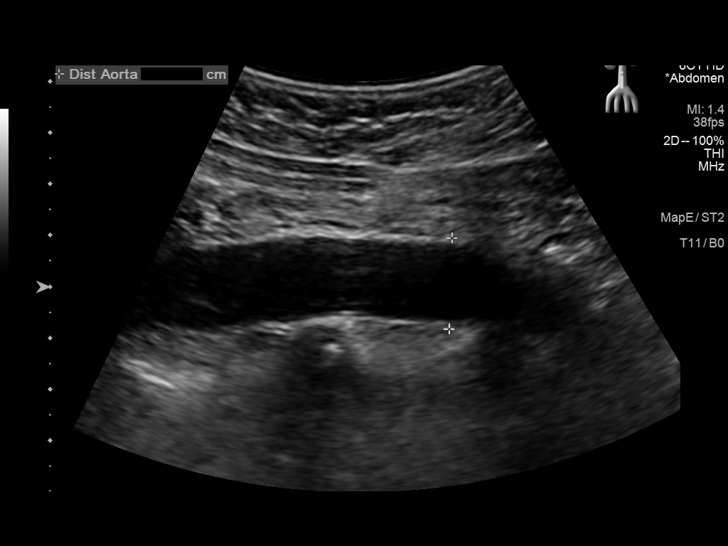
[im 115/115]
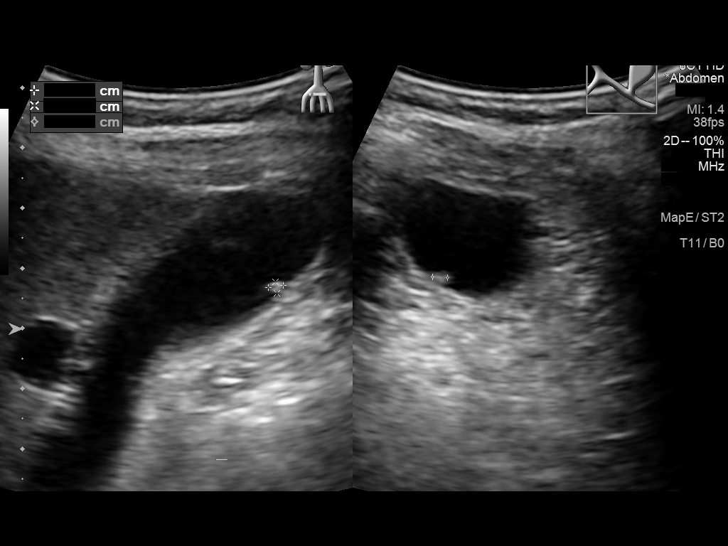

[13 of 25 positions shown; findings below may reference images not displayed]

FINDINGS: Gallbladder: No gallstones or wall thickening visualized. No
sonographic Murphy sign elicited on exam. No free pericholecystic
fluid. Two small gallbladder polyps are seen adherent to the
gallbladder wall, largest of which measures 3 mm.

Common bile duct: Diameter: 3.8 mm

Liver: Complex and multi septate cyst seen within the right hepatic
lobe, measuring 2.6 x 2.3 x 2.4 cm. In additional mildly complex
septated cyst within the inferior right hepatic lobe measures 0.9 x
1.0 x 1.0 cm. Additional simple cyst adjacent to the gallbladder
fossa measures 1.0 x 1.3 x 1.3 cm. Well-circumscribed hyperechoic
solid lesion within the right hepatic lobe measures 1.4 x 1.8 x
cm. Underlying hepatic parenchyma within normal limits for
echogenicity. Portal vein is patent on color Doppler imaging with
normal direction of blood flow towards the liver.

IVC: No abnormality visualized.

Pancreas: Not well visualized due to overlying bowel gas.

Spleen: Size and appearance within normal limits.

Right Kidney: Length: 9.8 cm. Echogenicity within normal limits. No
mass or hydronephrosis visualized.

Left Kidney: Length: 10.1 cm. Echogenicity within normal limits. No
mass or hydronephrosis visualized.

Abdominal aorta: No aneurysm visualized.

Other findings: None.
IMPRESSION: 1. 1.8 cm solid hyperechoic lesion within the right hepatic lobe.
While this may simply reflect a benign hemangioma, given the history
of elevated LFTs, further evaluation with dedicated liver protocol
MRI, with and without contrast, recommended for further evaluation.
2. Few additional scattered complex cysts within the liver as above,
largest of which measures 2.6 cm
3. Two small gallbladder polyps measuring up to 3 mm. These are
almost certainly benign given size, with no follow-up imaging
recommended.
4. Otherwise unremarkable abdominal ultrasound. No other acute
abnormality identified.

## 2023-01-27 IMAGING — US US PELVIS COMPLETE
1 series · 14 of 25 positions shown · non-contrast
Comparison: Prior ultrasound from 08/30/2017

CLINICAL DATA: Initial evaluation for left lower quadrant pain,
elevated PSA and LFTs.

EXAM:
ULTRASOUND OF THE MALE PELVIS

[Series 1: us pelvis complete · 0.24mm/px · 14 of 39 slices shown]
[im 1/39]
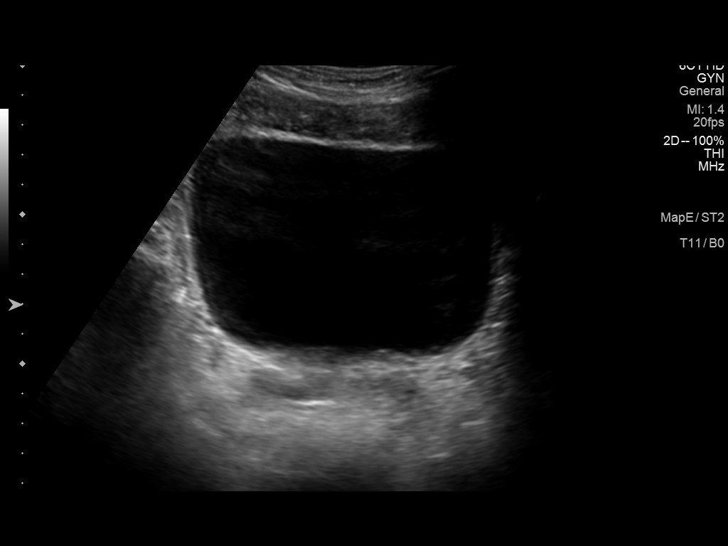
[im 4/39]
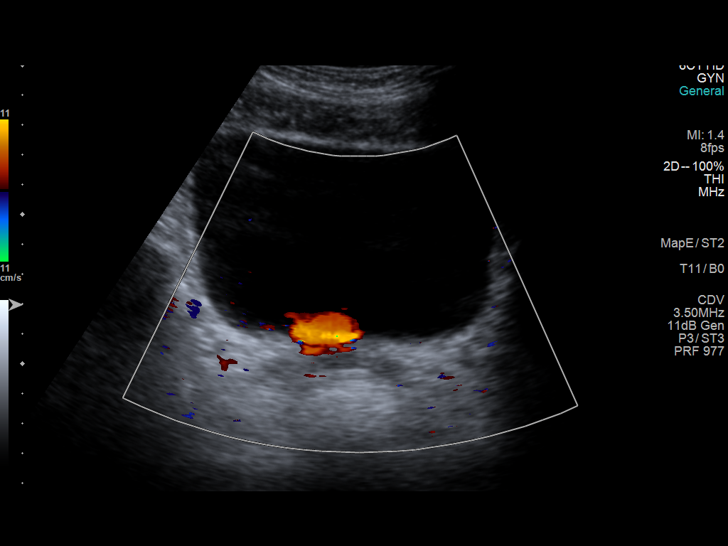
[im 7/39]
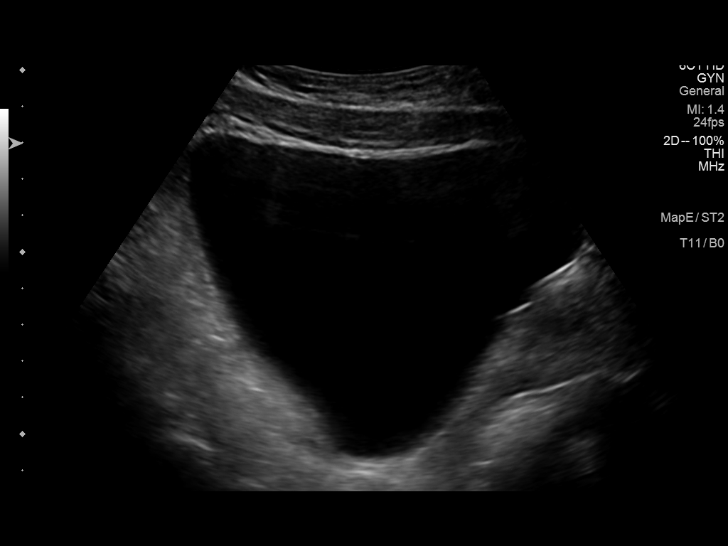
[im 10/39]
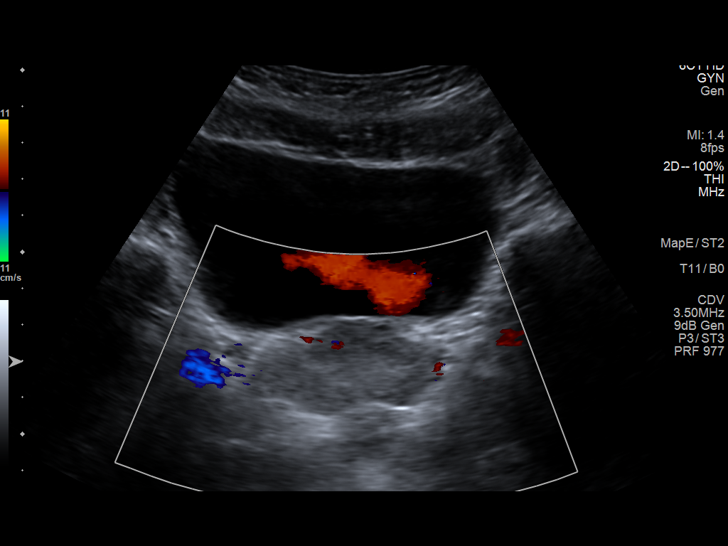
[im 13/39]
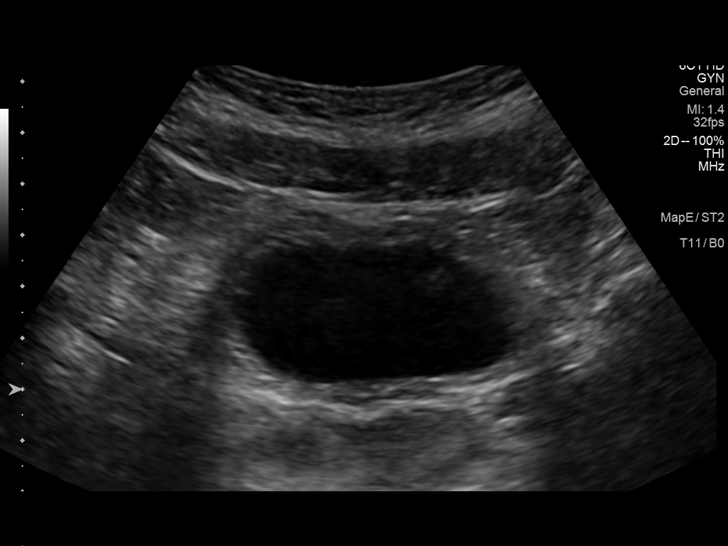
[im 15/39]
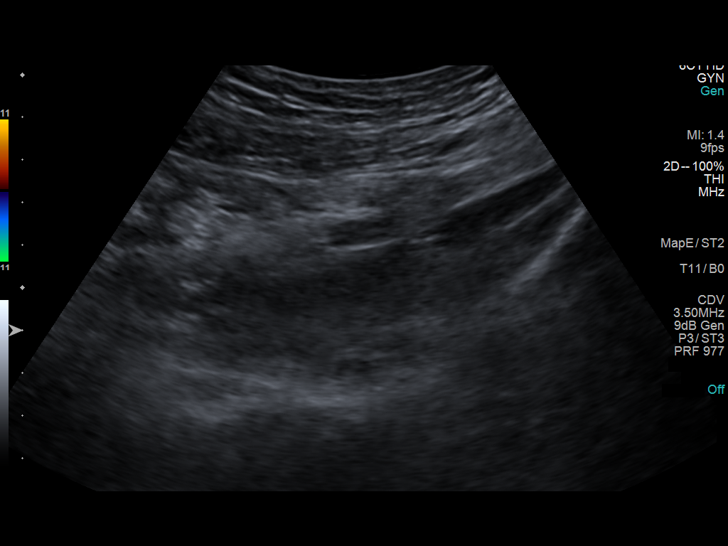
[im 18/39]
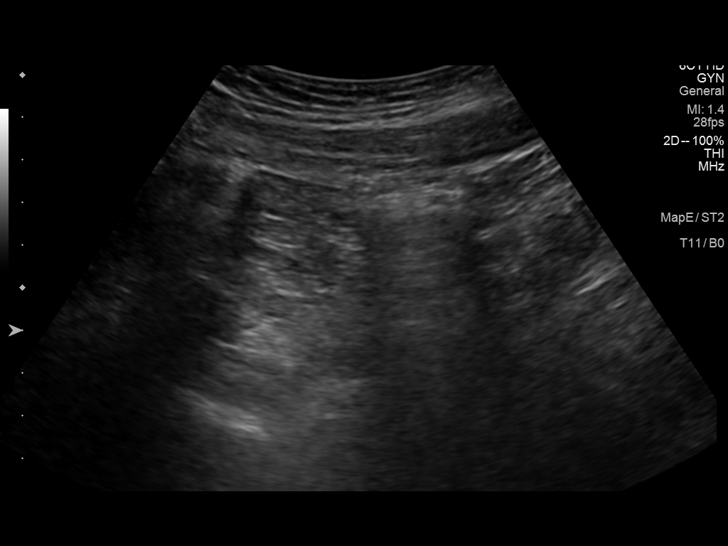
[im 21/39]
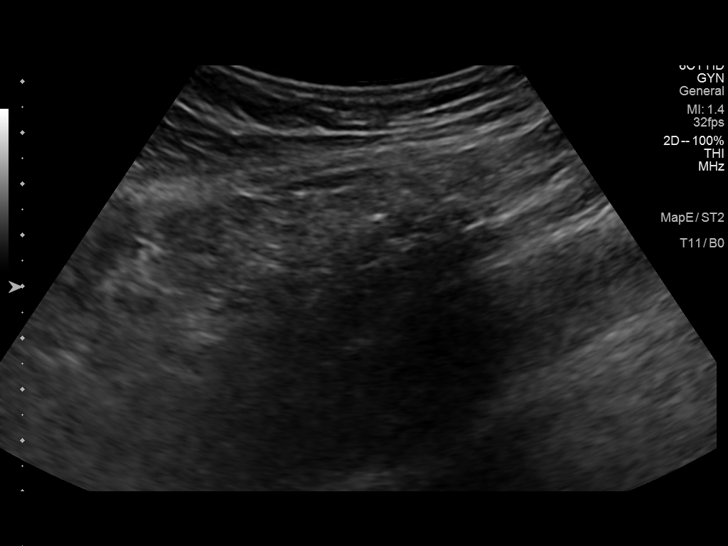
[im 24/39]
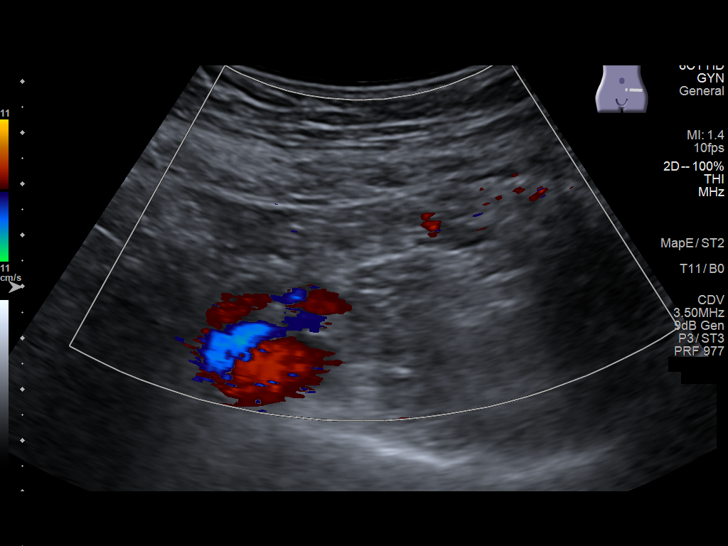
[im 26/39]
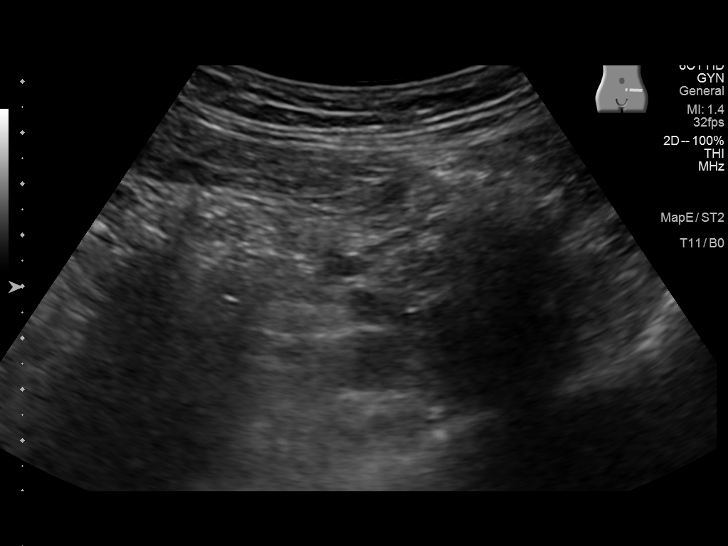
[im 29/39]
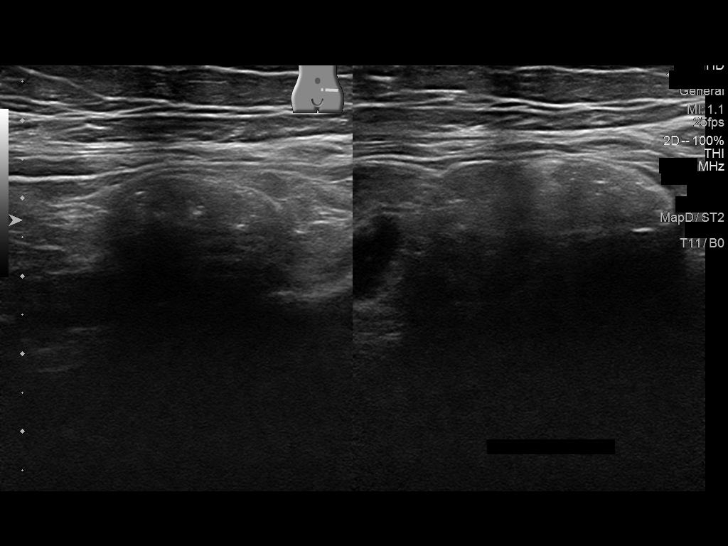
[im 32/39]
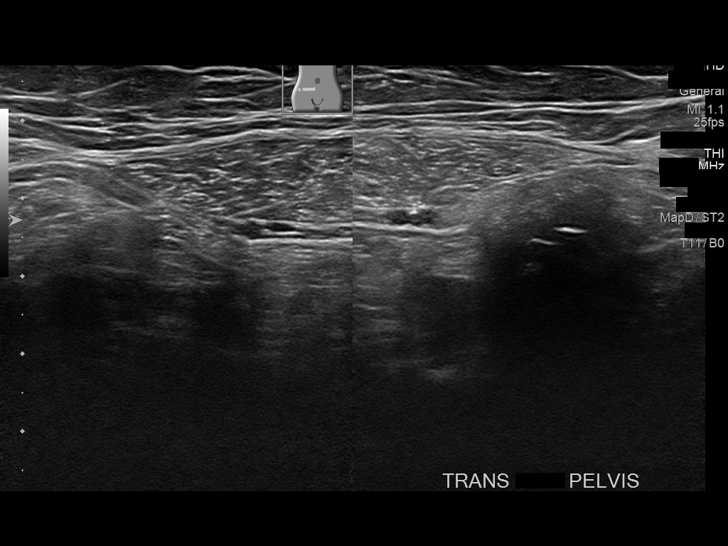
[im 35/39]
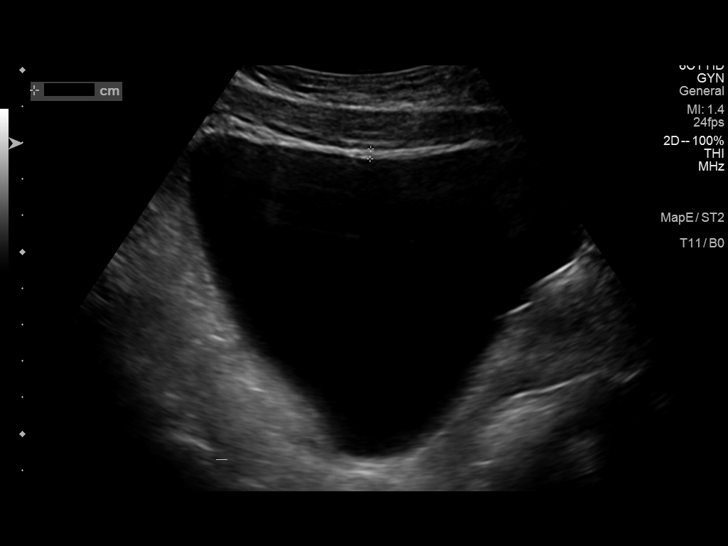
[im 39/39]
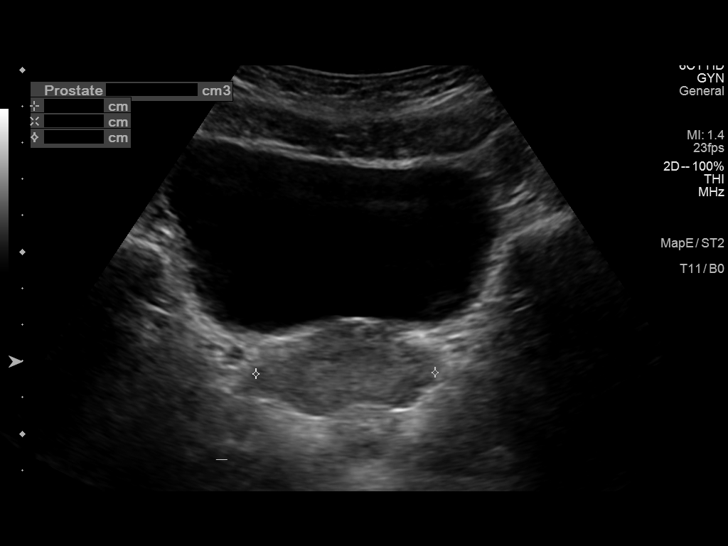

[14 of 25 positions shown; findings below may reference images not displayed]

FINDINGS: Prostate gland: Prostate gland normal in appearance measuring 3.4 x
2.6 x 4.9 cm. Volume = 23.1 mL. No discrete mass, calcification, or
other abnormality.

Seminal vesicles:  Normal.

Bladder: Bladder demonstrates a normal sonographic appearance
without abnormal wall thickening or mass. No intraluminal debris or
other abnormality. Prevoid volume measures 468.2 mL. Small postvoid
residual of 26.1 mL noted.

Additional target ultrasound at the left lower quadrant/left
inguinal region at patient's site of pain was performed. No visible
hernia. No other mass lesion, collection, or other structure
abnormality seen to correspond with patient's area of discomfort.
IMPRESSION: 1. Normal pelvic ultrasound. No abnormality identified at the left
lower quadrant/left inguinal region at patient's area of discomfort.
No visible hernia.
2. Normal sonographic appearance of the prostate gland. No discrete
mass or other abnormality.
3. Small postvoid residual of 26.1 mL within the bladder.

## 2023-03-09 ENCOUNTER — Encounter: Payer: Self-pay | Admitting: Family Medicine

## 2023-03-15 ENCOUNTER — Ambulatory Visit: Payer: BC Managed Care – PPO | Admitting: Family Medicine

## 2023-03-15 ENCOUNTER — Encounter: Payer: Self-pay | Admitting: Family Medicine

## 2023-03-15 VITALS — BP 100/62 | HR 86 | Ht 70.0 in | Wt 149.0 lb

## 2023-03-15 DIAGNOSIS — E782 Mixed hyperlipidemia: Secondary | ICD-10-CM

## 2023-03-15 DIAGNOSIS — R69 Illness, unspecified: Secondary | ICD-10-CM | POA: Diagnosis not present

## 2023-03-15 MED ORDER — ATORVASTATIN CALCIUM 10 MG PO TABS: 10.0000 mg | ORAL_TABLET | Freq: Every day | ORAL | 1 refills | Status: DC

## 2023-03-15 NOTE — Progress Notes (Signed)
Date:  03/15/2023   Name:  Patrick Fuller   DOB:  1966/01/13   MRN:  [id number]   Chief Complaint: Hyperlipidemia  Hyperlipidemia This is a chronic problem. The current episode started more than 1 year ago. The problem is controlled. Pertinent negatives include no chest pain, focal sensory loss, focal weakness, leg pain, myalgias or shortness of breath. Current antihyperlipidemic treatment includes diet change and statins. The current treatment provides moderate improvement of lipids. There are no compliance problems.  Risk factors for coronary artery disease include dyslipidemia, hypertension and post-menopausal.    Lab Results  Component Value Date   NA 142 08/03/2022   K 3.9 08/03/2022   CO2 22 08/03/2022   GLUCOSE 77 08/03/2022   BUN 12 08/03/2022   CREATININE 0.75 (L) 08/03/2022   CALCIUM 9.2 08/03/2022   EGFR 107 08/03/2022   GFRNONAA 105 07/18/2020   Lab Results  Component Value Date   CHOL 140 09/28/2022   HDL 38 (L) 09/28/2022   LDLCALC 84 09/28/2022   TRIG 96 09/28/2022   CHOLHDL 4.2 03/14/2018   Lab Results  Component Value Date   TSH 1.107 10/23/2016   No results found for: "HGBA1C" Lab Results  Component Value Date   WBC 4.3 07/28/2021   HGB 15.4 07/28/2021   HCT 47.0 07/28/2021   MCV 86 07/28/2021   PLT 292 07/28/2021   Lab Results  Component Value Date   ALT 26 08/03/2022   AST 22 08/03/2022   ALKPHOS 46 08/03/2022   BILITOT 1.1 08/03/2022   No results found for: "25OHVITD2", "25OHVITD3", "VD25OH"   Review of Systems  HENT:  Negative for congestion.   Eyes:  Negative for visual disturbance.  Respiratory:  Negative for cough, chest tightness, shortness of breath and wheezing.   Cardiovascular:  Negative for chest pain, palpitations and leg swelling.  Gastrointestinal:  Negative for abdominal pain and blood in stool.  Endocrine: Negative for polydipsia and polyuria.  Genitourinary:  Negative for decreased urine volume.   Musculoskeletal:  Negative for myalgias.  Neurological:  Negative for focal weakness.    Patient Active Problem List   Diagnosis Date Noted   Vasectomy evaluation 04/04/2018    Allergies  Allergen Reactions   Amoxicillin Hives   Erythromycin Nausea And Vomiting    Other reaction(s): Other (See Comments) Other Reaction: GI UPSET    Past Surgical History:  Procedure Laterality Date   NO PAST SURGERIES      Social History   Tobacco Use   Smoking status: Former    Passive exposure: Past   Smokeless tobacco: Never   Tobacco comments:    social in college  Substance Use Topics   Alcohol use: Yes    Alcohol/week: 0.0 standard drinks of alcohol    Comment: occasionally   Drug use: No     Medication list has been reviewed and updated.  Current Meds  Medication Sig   atorvastatin (LIPITOR) 10 MG tablet Take 1 tablet (10 mg total) by mouth daily.   Multiple Vitamins-Minerals (MULTIVITAMIN MEN 50+ PO) Take 1 tablet by mouth daily.   Riboflavin (VITAMIN B-2 PO) Take 1 tablet by mouth daily.   thiamine (VITAMIN B-1) 100 MG tablet Take 100 mg by mouth daily.       03/15/2023    3:47 PM 08/03/2022    9:50 AM 07/28/2021    9:31 AM 07/18/2020    8:50 AM  GAD 7 : Generalized Anxiety Score  Nervous, Anxious, on Edge  0 0 0 0  Control/stop worrying 0 0 0 0  Worry too much - different things 0 0 0 0  Trouble relaxing 0 0 0 0  Restless 0 0 0 0  Easily annoyed or irritable 0 0 0 0  Afraid - awful might happen 0 0 0 0  Total GAD 7 Score 0 0 0 0  Anxiety Difficulty Not difficult at all Not difficult at all Not difficult at all        03/15/2023    3:46 PM 08/03/2022    9:50 AM 07/28/2021    9:31 AM  Depression screen PHQ 2/9  Decreased Interest 0 0 0  Down, Depressed, Hopeless 0 0 0  PHQ - 2 Score 0 0 0  Altered sleeping 0 0 0  Tired, decreased energy 0 0 0  Change in appetite 0 0 0  Feeling bad or failure about yourself  0 0 0  Trouble concentrating 0 0 0  Moving  slowly or fidgety/restless 0 0 0  Suicidal thoughts 0 0 0  PHQ-9 Score 0 0 0  Difficult doing work/chores Not difficult at all Not difficult at all Not difficult at all    BP Readings from Last 3 Encounters:  03/15/23 100/62  08/03/22 90/68  08/26/21 107/69    Physical Exam Vitals and nursing note reviewed.  HENT:     Head: Normocephalic.     Right Ear: External ear normal.     Left Ear: External ear normal.     Nose: Nose normal.  Eyes:     General: No scleral icterus.       Right eye: No discharge.        Left eye: No discharge.     Conjunctiva/sclera: Conjunctivae normal.     Pupils: Pupils are equal, round, and reactive to light.  Neck:     Thyroid: No thyromegaly.     Vascular: No JVD.     Trachea: No tracheal deviation.  Cardiovascular:     Rate and Rhythm: Normal rate and regular rhythm.     Heart sounds: Normal heart sounds. No murmur heard.    No friction rub. No gallop.  Pulmonary:     Effort: No respiratory distress.     Breath sounds: Normal breath sounds. No wheezing or rales.  Abdominal:     General: Bowel sounds are normal.     Palpations: Abdomen is soft. There is no mass.     Tenderness: There is no abdominal tenderness. There is no guarding or rebound.  Musculoskeletal:        General: No tenderness. Normal range of motion.     Cervical back: Normal range of motion and neck supple.  Lymphadenopathy:     Cervical: No cervical adenopathy.  Skin:    General: Skin is warm.     Findings: No rash.  Neurological:     Mental Status: He is alert and oriented to person, place, and time.     Cranial Nerves: No cranial nerve deficit.     Deep Tendon Reflexes: Reflexes are normal and symmetric.     Wt Readings from Last 3 Encounters:  03/15/23 149 lb (67.6 kg)  08/03/22 149 lb (67.6 kg)  08/26/21 149 lb (67.6 kg)    BP 100/62   Pulse 86   Ht 5\' 10"  (1.778 m)   Wt 149 lb (67.6 kg)   SpO2 97%   BMI 21.38 kg/m   Assessment and Plan: 1. Mixed  hyperlipidemia New  onset.  Persistent.  Stable.  Likely will continue atorvastatin 10 mg once a day.  Will check lipid panel fasting patient will be coming back for this as well as an AST ALT to monitor for hepatotoxicity of statin agent. - atorvastatin (LIPITOR) 10 MG tablet; Take 1 tablet (10 mg total) by mouth daily.  Dispense: 90 tablet; Refill: 1 - Lipid Panel With LDL/HDL Ratio - AST - ALT  2. Taking medication for chronic disease As noted above    Elizabeth Sauer, MD

## 2023-03-27 ENCOUNTER — Encounter: Payer: Self-pay | Admitting: Family Medicine

## 2023-03-27 LAB — LIPID PANEL WITH LDL/HDL RATIO
Cholesterol, Total: 138 mg/dL (ref 100–199)
HDL: 43 mg/dL (ref >39)
LDL Chol Calc (NIH): 81 mg/dL (ref 0–99)
LDL/HDL Ratio: 1.9 ratio (ref 0.0–3.6)
Triglycerides: 72 mg/dL (ref 0–149)
VLDL Cholesterol Cal: 14 mg/dL (ref 5–40)

## 2023-03-27 LAB — AST: AST: 28 [IU]/L (ref 0–40)

## 2023-03-27 LAB — ALT: ALT: 33 [IU]/L (ref 0–44)

## 2023-08-09 ENCOUNTER — Ambulatory Visit (INDEPENDENT_AMBULATORY_CARE_PROVIDER_SITE_OTHER): Payer: BC Managed Care – PPO | Admitting: Family Medicine

## 2023-08-09 ENCOUNTER — Encounter: Payer: Self-pay | Admitting: Family Medicine

## 2023-08-09 VITALS — BP 108/62 | HR 75 | Ht 70.0 in | Wt 146.0 lb

## 2023-08-09 DIAGNOSIS — E782 Mixed hyperlipidemia: Secondary | ICD-10-CM

## 2023-08-09 DIAGNOSIS — Z0000 Encounter for general adult medical examination without abnormal findings: Secondary | ICD-10-CM | POA: Diagnosis not present

## 2023-08-09 DIAGNOSIS — R972 Elevated prostate specific antigen [PSA]: Secondary | ICD-10-CM | POA: Insufficient documentation

## 2023-08-09 MED ORDER — ATORVASTATIN CALCIUM 10 MG PO TABS: 10.0000 mg | ORAL_TABLET | Freq: Every day | ORAL | 2 refills | Status: DC

## 2023-08-09 NOTE — Progress Notes (Signed)
 Date:  08/09/2023   Name:  Patrick Fuller   DOB:  June 29, 1965   MRN:  [id number]   Chief Complaint: No chief complaint on file.  Patient is a 58 year old male who presents for a comprehensive physical exam. The patient reports the following problems: none. Health maintenance has been reviewed up to date.      Lab Results  Component Value Date   NA 142 08/03/2022   K 3.9 08/03/2022   CO2 22 08/03/2022   GLUCOSE 77 08/03/2022   BUN 12 08/03/2022   CREATININE 0.75 (L) 08/03/2022   CALCIUM 9.2 08/03/2022   EGFR 107 08/03/2022   GFRNONAA 105 07/18/2020   Lab Results  Component Value Date   CHOL 138 03/26/2023   HDL 43 03/26/2023   LDLCALC 81 03/26/2023   TRIG 72 03/26/2023   CHOLHDL 4.2 03/14/2018   Lab Results  Component Value Date   TSH 1.107 10/23/2016   No results found for: "HGBA1C" Lab Results  Component Value Date   WBC 4.3 07/28/2021   HGB 15.4 07/28/2021   HCT 47.0 07/28/2021   MCV 86 07/28/2021   PLT 292 07/28/2021   Lab Results  Component Value Date   ALT 33 03/26/2023   AST 28 03/26/2023   ALKPHOS 46 08/03/2022   BILITOT 1.1 08/03/2022   No results found for: "25OHVITD2", "25OHVITD3", "VD25OH"   Review of Systems  Constitutional:  Negative for chills and fever.  HENT:  Negative for drooling, ear discharge, ear pain and sore throat.   Respiratory:  Negative for cough, shortness of breath and wheezing.   Cardiovascular:  Negative for chest pain, palpitations and leg swelling.  Gastrointestinal:  Negative for abdominal pain, blood in stool, constipation, diarrhea and nausea.  Endocrine: Negative for polydipsia.  Genitourinary:  Negative for dysuria, frequency, hematuria and urgency.  Musculoskeletal:  Negative for back pain, myalgias and neck pain.  Skin:  Negative for rash.  Allergic/Immunologic: Negative for environmental allergies.  Neurological:  Negative for dizziness and headaches.  Hematological:  Negative for adenopathy. Does not  bruise/bleed easily.  Psychiatric/Behavioral:  Negative for suicidal ideas. The patient is not nervous/anxious.     Patient Active Problem List   Diagnosis Date Noted   PSA elevation 08/09/2023   Mitral valve prolapse 06/22/1980    Allergies  Allergen Reactions   Amoxicillin Hives   Erythromycin Nausea And Vomiting    Other reaction(s): Other (See Comments) Other Reaction: GI UPSET    Past Surgical History:  Procedure Laterality Date   NO PAST SURGERIES      Social History   Tobacco Use   Smoking status: Former    Passive exposure: Past   Smokeless tobacco: Never   Tobacco comments:    social in college  Substance Use Topics   Alcohol use: Yes    Alcohol/week: 0.0 standard drinks of alcohol    Comment: occasionally   Drug use: No     Medication list has been reviewed and updated.  Current Meds  Medication Sig   atorvastatin (LIPITOR) 10 MG tablet Take 1 tablet (10 mg total) by mouth daily.   Multiple Vitamins-Minerals (MULTIVITAMIN MEN 50+ PO) Take 1 tablet by mouth daily.   Riboflavin (VITAMIN B-2 PO) Take 1 tablet by mouth daily.   thiamine (VITAMIN B-1) 100 MG tablet Take 100 mg by mouth daily.       08/09/2023    9:21 AM 03/15/2023    3:47 PM 08/03/2022  9:50 AM 07/28/2021    9:31 AM  GAD 7 : Generalized Anxiety Score  Nervous, Anxious, on Edge 0 0 0 0  Control/stop worrying 0 0 0 0  Worry too much - different things 0 0 0 0  Trouble relaxing 0 0 0 0  Restless 0 0 0 0  Easily annoyed or irritable 0 0 0 0  Afraid - awful might happen 0 0 0 0  Total GAD 7 Score 0 0 0 0  Anxiety Difficulty Not difficult at all Not difficult at all Not difficult at all Not difficult at all       08/09/2023    9:21 AM 03/15/2023    3:46 PM 08/03/2022    9:50 AM  Depression screen PHQ 2/9  Decreased Interest 0 0 0  Down, Depressed, Hopeless 0 0 0  PHQ - 2 Score 0 0 0  Altered sleeping 0 0 0  Tired, decreased energy 0 0 0  Change in appetite 0 0 0  Feeling bad  or failure about yourself  0 0 0  Trouble concentrating 0 0 0  Moving slowly or fidgety/restless 0 0 0  Suicidal thoughts 0 0 0  PHQ-9 Score 0 0 0  Difficult doing work/chores Not difficult at all Not difficult at all Not difficult at all    BP Readings from Last 3 Encounters:  08/09/23 108/62  03/15/23 100/62  08/03/22 90/68    Physical Exam Vitals and nursing note reviewed.  Constitutional:      Appearance: He is well-developed.  HENT:     Head: Normocephalic and atraumatic.     Right Ear: Tympanic membrane, ear canal and external ear normal.     Left Ear: Tympanic membrane, ear canal and external ear normal.     Nose: Nose normal.     Mouth/Throat:     Dentition: Normal dentition.  Eyes:     General: Lids are normal. No scleral icterus.    Conjunctiva/sclera: Conjunctivae normal.     Pupils: Pupils are equal, round, and reactive to light.  Neck:     Thyroid: No thyromegaly.     Vascular: No carotid bruit, hepatojugular reflux or JVD.     Trachea: No tracheal deviation.  Cardiovascular:     Rate and Rhythm: Normal rate and regular rhythm.     Heart sounds: Normal heart sounds.  Pulmonary:     Effort: Pulmonary effort is normal.     Breath sounds: Normal breath sounds.  Abdominal:     General: Bowel sounds are normal.     Palpations: Abdomen is soft. There is no hepatomegaly, splenomegaly or mass.     Tenderness: There is no abdominal tenderness.     Hernia: There is no hernia in the left inguinal area.  Genitourinary:    Penis: Normal.      Testes: Normal.     Prostate: Normal. Not enlarged, not tender and no nodules present.     Rectum: Normal. Guaiac result negative. No mass.  Musculoskeletal:        General: Normal range of motion.     Cervical back: Normal range of motion and neck supple.  Lymphadenopathy:     Cervical: No cervical adenopathy.  Skin:    General: Skin is warm and dry.     Findings: No rash.  Neurological:     Mental Status: He is alert  and oriented to person, place, and time.     Sensory: No sensory deficit.  Deep Tendon Reflexes: Reflexes are normal and symmetric.  Psychiatric:        Mood and Affect: Mood is not anxious or depressed.     Wt Readings from Last 3 Encounters:  08/09/23 146 lb (66.2 kg)  03/15/23 149 lb (67.6 kg)  08/03/22 149 lb (67.6 kg)    BP 108/62   Pulse 75   Ht 5\' 10"  (1.778 m)   Wt 146 lb (66.2 kg)   SpO2 98%   BMI 20.95 kg/m   Assessment and Plan: Patrick Fuller is a 58 y.o. male who presents today for his Complete Annual Exam. He feels well. He reports exercising . He reports he is sleeping well.  Immunizations are reviewed and recommendations provided.   Age appropriate screening tests are discussed. Counseling given for risk factor reduction interventions.  1. Annual physical exam (Primary) No subjective/objective concerns noted during HPI, review of past medical history/review of medications/review of labs/, review of systems and physical exam.  Will check CMP lipid panel and PSA as part of her usual annual physical lab work.  Will recheck patient in 1 year for physical - Comprehensive metabolic panel - Lipid Panel With LDL/HDL Ratio - PSA  2. Mixed hyperlipidemia Chronic.  Controlled.  Stable. - atorvastatin (LIPITOR) 10 MG tablet; Take 1 tablet (10 mg total) by mouth daily.  Dispense: 90 tablet; Refill: 2 - Comprehensive metabolic panel - Lipid Panel With LDL/HDL Ratio    Elizabeth Sauer, MD

## 2023-08-10 ENCOUNTER — Encounter: Payer: Self-pay | Admitting: Family Medicine

## 2023-08-10 LAB — LIPID PANEL WITH LDL/HDL RATIO
Cholesterol, Total: 137 mg/dL (ref 100–199)
HDL: 43 mg/dL (ref >39)
LDL Chol Calc (NIH): 78 mg/dL (ref 0–99)
LDL/HDL Ratio: 1.8 ratio (ref 0.0–3.6)
Triglycerides: 82 mg/dL (ref 0–149)
VLDL Cholesterol Cal: 16 mg/dL (ref 5–40)

## 2023-08-10 LAB — COMPREHENSIVE METABOLIC PANEL
ALT: 32 IU/L (ref 0–44)
AST: 26 IU/L (ref 0–40)
Albumin: 4.8 g/dL (ref 3.8–4.9)
Alkaline Phosphatase: 43 IU/L — ABNORMAL LOW (ref 44–121)
BUN/Creatinine Ratio: 15 (ref 9–20)
BUN: 12 mg/dL (ref 6–24)
Bilirubin Total: 1.4 mg/dL — ABNORMAL HIGH (ref 0.0–1.2)
CO2: 25 mmol/L (ref 20–29)
Calcium: 9.4 mg/dL (ref 8.7–10.2)
Chloride: 104 mmol/L (ref 96–106)
Creatinine, Ser: 0.79 mg/dL (ref 0.76–1.27)
Globulin, Total: 1.5 g/dL (ref 1.5–4.5)
Glucose: 87 mg/dL (ref 70–99)
Potassium: 5.2 mmol/L (ref 3.5–5.2)
Sodium: 141 mmol/L (ref 134–144)
Total Protein: 6.3 g/dL (ref 6.0–8.5)
eGFR: 105 mL/min/{1.73_m2} (ref >59)

## 2023-08-10 LAB — PSA: Prostate Specific Ag, Serum: 0.7 ng/mL (ref 0.0–4.0)

## 2024-06-09 ENCOUNTER — Other Ambulatory Visit: Payer: Self-pay

## 2024-06-09 DIAGNOSIS — E782 Mixed hyperlipidemia: Secondary | ICD-10-CM

## 2024-06-09 MED ORDER — ATORVASTATIN CALCIUM 10 MG PO TABS: 10.0000 mg | ORAL_TABLET | Freq: Every day | ORAL | 2 refills | Status: AC

## 2024-07-07 ENCOUNTER — Ambulatory Visit: Admitting: Physician Assistant

## 2024-08-14 ENCOUNTER — Encounter: Admitting: Physician Assistant
